# Patient Record
Sex: Female | Born: 1995 | Race: White | Hispanic: No | Marital: Single | Smoking: Never smoker
Health system: Southern US, Community
[De-identification: ages and names within clinical notes are randomized; demographics above are authoritative.]

## PROBLEM LIST (undated history)

## (undated) ENCOUNTER — Inpatient Hospital Stay (HOSPITAL_COMMUNITY): Payer: Self-pay

## (undated) DIAGNOSIS — R51 Headache: Secondary | ICD-10-CM

## (undated) HISTORY — PX: OTHER SURGICAL HISTORY: SHX169

---
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
## 2018-06-06 ENCOUNTER — Other Ambulatory Visit (HOSPITAL_COMMUNITY)
Admission: RE | Admit: 2018-06-06 | Discharge: 2018-06-06 | Disposition: A | Discharge status: Home / Self Care | Payer: Medicaid Other | Source: Ambulatory Visit | Attending: Obstetrics & Gynecology | Admitting: Obstetrics & Gynecology

## 2018-06-06 ENCOUNTER — Other Ambulatory Visit: Payer: Self-pay

## 2018-06-06 ENCOUNTER — Encounter: Payer: Self-pay | Admitting: Women's Health

## 2018-06-06 ENCOUNTER — Ambulatory Visit (INDEPENDENT_AMBULATORY_CARE_PROVIDER_SITE_OTHER): Payer: Medicaid Other | Admitting: Women's Health

## 2018-06-06 VITALS — BP 124/77 | HR 98 | Ht 62.0 in | Wt 120.0 lb

## 2018-06-06 DIAGNOSIS — Z309 Encounter for contraceptive management, unspecified: Secondary | ICD-10-CM | POA: Diagnosis not present

## 2018-06-06 DIAGNOSIS — Z3009 Encounter for other general counseling and advice on contraception: Secondary | ICD-10-CM | POA: Diagnosis not present

## 2018-06-06 DIAGNOSIS — Z01419 Encounter for gynecological examination (general) (routine) without abnormal findings: Secondary | ICD-10-CM

## 2018-06-06 DIAGNOSIS — Z113 Encounter for screening for infections with a predominantly sexual mode of transmission: Secondary | ICD-10-CM | POA: Diagnosis not present

## 2018-06-06 LAB — POCT WET PREP (WET MOUNT)
Clue Cells Wet Prep Whiff POC: POSITIVE
Trichomonas Wet Prep HPF POC: ABSENT

## 2018-06-06 MED ORDER — METRONIDAZOLE 500 MG PO TABS: 500.0000 mg | ORAL_TABLET | Freq: Two times a day (BID) | ORAL | 0 refills | Status: DC

## 2018-06-06 NOTE — Progress Notes (Signed)
WELL-WOMAN EXAMINATION Patient name: Claudia Marks MRN [id number]  Date of birth: Jan 21, 1996 Chief Complaint:   Gynecologic Exam  History of Present Illness:   Claudia Marks is a 22 y.o. G22P1001 Caucasian female being seen today for a routine FP [facility] well-woman exam.  Current complaints: vaginal d/c w/ odor, no itching/irritation  PCP: none      No LMP recorded. Patient has had an implant. The current method of family planning is Nexplanon placed 2016, due to come out Last pap never. Results were: n/a Last mammogram: never. Results were: n/a Last colonoscopy: never. Results were: n/a  Review of Systems:   Pertinent items are noted in HPI Denies any headaches, blurred vision, fatigue, shortness of breath, chest pain, abdominal pain, abnormal vaginal discharge/itching/odor/irritation, problems with periods, bowel movements, urination, or intercourse unless otherwise stated above. Pertinent History Reviewed:  Reviewed past medical,surgical, social and family history.  Reviewed problem list, medications and allergies. Physical Assessment:   Vitals:   06/06/18 1022  BP: 124/77  Pulse: 98  Weight: 120 lb (54.4 kg)  Height: 5\' 2"  (1.575 m)  Body mass index is 21.95 kg/m.        Physical Examination:   General appearance - well appearing, and in no distress  Mental status - alert, oriented to person, place, and time  Psych:  She has a normal mood and affect  Skin - warm and dry, normal color, no suspicious lesions noted  Chest - effort normal, all lung fields clear to auscultation bilaterally  Heart - normal rate and regular rhythm  Neck:  midline trachea, no thyromegaly or nodules  Breasts - breasts appear normal, no suspicious masses, no skin or nipple changes or  axillary nodes  Abdomen - soft, nontender, nondistended, no masses or organomegaly  Pelvic - VULVA: normal appearing vulva with no masses, tenderness or lesions  VAGINA: normal appearing vagina with normal color and  malodorous discharge, no lesions  CERVIX: normal appearing cervix without discharge or lesions, no CMT  Thin prep pap is done w/ reflex HR HPV cotesting  UTERUS: uterus is felt to be normal size, shape, consistency and nontender   ADNEXA: No adnexal masses or tenderness noted.  Extremities:  No swelling or varicosities noted  Results for orders placed or performed in visit on 06/06/18 (from the past 24 hour(s))  POCT Wet Prep [facility])   Collection Time: 06/06/18 11:21 AM  Result Value Ref Range   Source Wet Prep POC vaginal    WBC, Wet Prep HPF POC few    Bacteria Wet Prep HPF POC Few Few   BACTERIA WET PREP MORPHOLOGY POC     Clue Cells Wet Prep HPF POC Many (A) None   Clue Cells Wet Prep Whiff POC Positive Whiff    Yeast Wet Prep HPF POC None None   KOH Wet Prep POC     Trichomonas Wet Prep HPF POC Absent Absent    Assessment & Plan:  1) FP [facility] Well-Woman Exam  2) BV> Rx metronidazole 500mg  BID x 7d for BV, no sex or etoh while taking   Labs/procedures today: pap w/ gc/ct, wet prep, hiv, rpr  Mammogram @21yo  or sooner if problems Colonoscopy @21yo  or sooner if problems  Orders Placed This Encounter  Procedures  . HIV Antibody (routine testing w rflx)  . RPR  . POCT Wet Prep [facility])    Follow-up: Return in about 3 weeks (around 06/27/2018) for Nexplanon removal/re insertion, order today please.  [place], [facility] 06/06/2018 11:21 AM

## 2018-06-07 LAB — RPR: RPR Ser Ql: NONREACTIVE

## 2018-06-07 LAB — CYTOLOGY - PAP
CHLAMYDIA, DNA PROBE: NEGATIVE
Diagnosis: NEGATIVE
NEISSERIA GONORRHEA: NEGATIVE

## 2018-06-07 LAB — HIV ANTIBODY (ROUTINE TESTING W REFLEX): HIV Screen 4th Generation wRfx: NONREACTIVE

## 2018-06-27 ENCOUNTER — Other Ambulatory Visit: Payer: Self-pay

## 2018-06-27 ENCOUNTER — Ambulatory Visit (INDEPENDENT_AMBULATORY_CARE_PROVIDER_SITE_OTHER): Payer: Medicaid Other | Admitting: Women's Health

## 2018-06-27 ENCOUNTER — Encounter: Payer: Self-pay | Admitting: Women's Health

## 2018-06-27 VITALS — BP 116/75 | HR 72 | Ht 62.0 in | Wt 121.0 lb

## 2018-06-27 DIAGNOSIS — Z3202 Encounter for pregnancy test, result negative: Secondary | ICD-10-CM | POA: Diagnosis not present

## 2018-06-27 DIAGNOSIS — Z3046 Encounter for surveillance of implantable subdermal contraceptive: Secondary | ICD-10-CM

## 2018-06-27 DIAGNOSIS — Z3049 Encounter for surveillance of other contraceptives: Secondary | ICD-10-CM | POA: Diagnosis not present

## 2018-06-27 DIAGNOSIS — Z30017 Encounter for initial prescription of implantable subdermal contraceptive: Secondary | ICD-10-CM | POA: Insufficient documentation

## 2018-06-27 LAB — POCT URINE PREGNANCY: Preg Test, Ur: NEGATIVE

## 2018-06-27 MED ORDER — ETONOGESTREL 68 MG ~~LOC~~ IMPL
68.0000 mg | DRUG_IMPLANT | Freq: Once | SUBCUTANEOUS | Status: AC
  Administered 2018-06-27: 68 mg via SUBCUTANEOUS

## 2018-06-27 NOTE — Patient Instructions (Signed)

## 2018-06-27 NOTE — Progress Notes (Signed)
   NEXPLANON REMOVAL AND RE-INSERTION Patient name: Claudia Marks MRN [id number]  Date of birth: Feb 13, 1996 Subjective Findings:   Claudia Marks is a 22 y.o. G42P1001 Caucasian female being seen today for Nexplanon removal and re-insertion. Her Nexplanon was placed 2016.   Patient's last menstrual period was 06/08/2018. Last pap 06/06/18. Results were:  normal  Risks/benefits/side effects of Nexplanon have been discussed and her questions have been answered.  Specifically, a failure rate of 09/998 has been reported, with an increased failure rate if pt takes [place] and/or antiseizure medicaitons.  She is aware of the common side effect of irregular bleeding, which the incidence of decreases over time. Signed copy of informed consent in chart.  Pertinent History Reviewed:   Reviewed past medical,surgical, social, obstetrical and family history.  Reviewed problem list, medications and allergies. Objective Findings & Procedure:    Vitals:   06/27/18 1532  BP: 116/75  Pulse: 72  Weight: 121 lb (54.9 kg)  Height: 5\' 2"  (1.575 m)  Body mass index is 22.13 kg/m.  Results for orders placed or performed in visit on 06/27/18 (from the past 24 hour(s))  POCT urine pregnancy   Collection Time: 06/27/18  3:34 PM  Result Value Ref Range   Preg Test, Ur Negative Negative     Time out was performed.  Nexplanon site identified.  Area prepped in usual sterile fashon. Two cc's of 2% lidocaine was used to anesthetize the area. A small stab incision was made right beside the implant on the distal portion.  The Nexplanon rod was grasped using hemostats and removed intact without difficulty.  The area was cleansed again with betadine and the Nexplanon was inserted approximately 10cm from the medial epicondyle and 3-5cm posterior to the sulcus per manufacturer's recommendations without difficulty.  Steri-strips and a pressure bandage was applied.  There was less than 3 cc blood loss. There were no  complications.  The patient tolerated the procedure well. Assessment & Plan:   1) Nexplanon removal & re-insertion She was instructed to keep the area clean and dry, remove pressure bandage in 24 hours, and keep insertion site covered with the steri-strips for 3-5 days.  She was given a card indicating date Nexplanon was inserted and date it needs to be removed.  Follow-up PRN problems.  Orders Placed This Encounter  Procedures  . POCT urine pregnancy    Follow-up: Return for after 9/16 for physical.  Roma Schanz CNM, [facility] 06/27/2018 4:06 PM

## 2019-09-28 ENCOUNTER — Ambulatory Visit: Payer: Medicaid Other | Attending: Internal Medicine

## 2019-09-28 ENCOUNTER — Other Ambulatory Visit: Payer: Self-pay

## 2019-09-28 DIAGNOSIS — Z20822 Contact with and (suspected) exposure to covid-19: Secondary | ICD-10-CM

## 2019-09-30 LAB — NOVEL CORONAVIRUS, NAA: SARS-CoV-2, NAA: NOT DETECTED

## 2020-05-23 ENCOUNTER — Encounter: Payer: Self-pay | Admitting: Women's Health

## 2020-05-23 ENCOUNTER — Ambulatory Visit (INDEPENDENT_AMBULATORY_CARE_PROVIDER_SITE_OTHER): Payer: BC Managed Care – PPO | Admitting: Women's Health

## 2020-05-23 VITALS — BP 108/75 | HR 75 | Ht 62.0 in | Wt 132.0 lb

## 2020-05-23 DIAGNOSIS — Z3046 Encounter for surveillance of implantable subdermal contraceptive: Secondary | ICD-10-CM

## 2020-05-23 NOTE — Patient Instructions (Signed)
Keep the area clean and dry.  You can remove the big bandage in 24 hours, and the small steri-strip bandage in 3-5 days.  

## 2020-05-23 NOTE — Progress Notes (Signed)
   [facility] REMOVAL Patient name: Claudia Marks MRN [id number]  Date of birth: 1996-05-05 Subjective Findings:   Claudia Marks is a 24 y.o. G52P1001 Caucasian female being seen today for removal of a Nexplanon. Her Nexplanon was placed 06/27/18.  She desires removal because she wants to get pregnant. Signed copy of informed consent in chart.   No LMP recorded. Patient has had an implant. Last pap9/16/19. Results were:  normal The planned method of family planning is none Depression screen PHQ 2/9 06/06/2018  Decreased Interest 0  Down, Depressed, Hopeless 0  PHQ - 2 Score 0    Pertinent History Reviewed:   Reviewed past medical,surgical, social, obstetrical and family history.  Reviewed problem list, medications and allergies. Objective Findings & Procedure:    Vitals:   05/23/20 1343  BP: 108/75  Pulse: 75  Weight: 132 lb (59.9 kg)  Height: 5\' 2"  (1.575 m)  Body mass index is 24.14 kg/m.  No results found for this or any previous visit (from the past 24 hour(s)).   Time out was performed.  Nexplanon site identified.  Area prepped in usual sterile fashon. One cc of 2% lidocaine was used to anesthetize the area at the distal end of the implant. A small stab incision was made right beside the implant on the distal portion.  The Nexplanon rod was grasped using hemostats and removed without difficulty.  There was less than 3 cc blood loss. There were no complications.  Steri-strips were applied over the small incision and a pressure bandage was applied.  The patient tolerated the procedure well. Assessment & Plan:   1) Nexplanon removal She was instructed to keep the area clean and dry, remove pressure bandage in 24 hours, and keep insertion site covered with the steri-strip for 3-5 days.   Follow-up PRN problems. Start pnv now, let us know when gets +HPT.   No orders of the defined types were placed in this encounter.   Follow-up: Return in about 1 year (around 05/23/2021) for Pap  & physical.  Roma Schanz CNM, [facility] 05/23/2020 2:05 PM

## 2020-07-01 ENCOUNTER — Other Ambulatory Visit: Payer: Medicaid Other

## 2020-07-01 ENCOUNTER — Other Ambulatory Visit: Payer: Self-pay | Admitting: *Deleted

## 2020-07-01 DIAGNOSIS — Z20822 Contact with and (suspected) exposure to covid-19: Secondary | ICD-10-CM

## 2020-07-02 LAB — SARS-COV-2, NAA 2 DAY TAT

## 2020-07-02 LAB — SPECIMEN STATUS REPORT

## 2020-07-02 LAB — NOVEL CORONAVIRUS, NAA: SARS-CoV-2, NAA: NOT DETECTED

## 2020-09-19 ENCOUNTER — Other Ambulatory Visit: Payer: Self-pay

## 2020-09-19 ENCOUNTER — Ambulatory Visit (INDEPENDENT_AMBULATORY_CARE_PROVIDER_SITE_OTHER): Payer: BC Managed Care – PPO | Admitting: *Deleted

## 2020-09-19 VITALS — BP 115/73 | HR 81

## 2020-09-19 DIAGNOSIS — Z3201 Encounter for pregnancy test, result positive: Secondary | ICD-10-CM | POA: Diagnosis not present

## 2020-09-19 DIAGNOSIS — N926 Irregular menstruation, unspecified: Secondary | ICD-10-CM | POA: Diagnosis not present

## 2020-09-19 LAB — POCT URINE PREGNANCY: Preg Test, Ur: POSITIVE — AB

## 2020-09-19 NOTE — Progress Notes (Signed)
   NURSE VISIT- PREGNANCY CONFIRMATION   SUBJECTIVE:  Claudia Marks is a 24 y.o. G40P1001 female at [redacted]w[redacted]d by certain LMP of Patient's last menstrual period was 08/03/2020. Here for pregnancy confirmation.  Home pregnancy test: positive x 7  She reports nausea.  She is taking prenatal vitamins.    OBJECTIVE:  BP 115/73 (BP Location: Left Arm, Patient Position: Sitting, Cuff Size: Normal)   Pulse 81   LMP 08/03/2020   Appears well, in no apparent distress OB History  Gravida Para Term Preterm AB Living  2 1 1     1   SAB IAB Ectopic Multiple Live Births          1    # Outcome Date GA Lbr Len/2nd Weight Sex Delivery Anes PTL Lv  2 Current           1 Term 01/27/15 [redacted]w[redacted]d 07:03 / 01:00 8 lb 3.8 oz (3.737 kg) F Vag-Spont EPI  LIV    Results for orders placed or performed in visit on 09/19/20 (from the past 24 hour(s))  POCT urine pregnancy   Collection Time: 09/19/20  3:05 PM  Result Value Ref Range   Preg Test, Ur Positive (A) Negative    ASSESSMENT: Positive pregnancy test, [redacted]w[redacted]d by LMP    PLAN: Schedule for dating ultrasound in 1-2 weeks Prenatal vitamins: continue   Nausea medicines: not currently needed   OB packet given: Yes  [redacted]w[redacted]d  09/19/2020 3:11 PM

## 2020-09-19 NOTE — Progress Notes (Signed)
Chart reviewed for nurse visit. Agree with plan of care.  Kenyata Guess A, NP 09/19/2020 5:03 PM  

## 2020-09-19 NOTE — Progress Notes (Signed)
Chart reviewed for nurse visit. Agree with plan of care.  Adline Potter, NP 09/19/2020 5:03 PM

## 2020-09-21 NOTE — L&D Delivery Note (Signed)
[facility] Delivery Note  Claudia Marks is a 25 y.o. G2P1001 s/p NSVD at [redacted]w[redacted]d She was admitted for spontaneous labor at term. With known gestational thrombocytopenia    ROM: 0h 261mith Clear fluid GBS Status: Negative Maximum Maternal Temperature: 97.9  Labor Progress: Patient admitted for spontaneous labor with normal progress. After AROM patient was quickly fully dilated.   Delivery Date/Time: 81/19/2022 at 10:03am Delivery: Called to room and patient was complete and pushing. Fetus was in a prolonged deceleration likely related to rapid progress after AROM. Head delivered LOA with Ritgen maneuver. No nuchal cord present. Shoulder and body delivered in usual fashion. Infant with spontaneous cry, placed on mother's abdomen, dried and stimulated. Cord clamped x 2 immediately, and cut by CNM. Baby passed to waiting NICU team. Cord blood drawn. Placenta delivered spontaneously with gentle cord traction. Fundus firm with massage and Pitocin. Labia, perineum, vagina, and cervix inspected inspected with no lacerations.   Placenta: spontaneous/complete/intact Complications: None Lacerations: None EBL: 200 cc  Analgesia: Epidural   Postpartum Planning '[X]'$  message to sent to schedule follow-up  '[X]'$  vaccines UTD  Infant: Female  APGARs 8/9  weight pending  HeMarcille BuffyNP, CNM  05/09/21  10:17 AM

## 2020-10-02 ENCOUNTER — Encounter: Payer: Self-pay | Admitting: *Deleted

## 2020-10-02 ENCOUNTER — Other Ambulatory Visit: Payer: Self-pay | Admitting: Obstetrics & Gynecology

## 2020-10-02 DIAGNOSIS — O3680X0 Pregnancy with inconclusive fetal viability, not applicable or unspecified: Secondary | ICD-10-CM

## 2020-10-03 ENCOUNTER — Other Ambulatory Visit: Payer: Self-pay

## 2020-10-03 ENCOUNTER — Ambulatory Visit (INDEPENDENT_AMBULATORY_CARE_PROVIDER_SITE_OTHER): Payer: BC Managed Care – PPO

## 2020-10-03 DIAGNOSIS — Z3A08 8 weeks gestation of pregnancy: Secondary | ICD-10-CM | POA: Diagnosis not present

## 2020-10-03 DIAGNOSIS — O3680X0 Pregnancy with inconclusive fetal viability, not applicable or unspecified: Secondary | ICD-10-CM | POA: Diagnosis not present

## 2020-10-03 NOTE — Progress Notes (Signed)
[place] 8+5 wks,single IUP with YS,positive fht 171 bpm,normal ovaries,crl 18.50 mm

## 2020-10-24 ENCOUNTER — Other Ambulatory Visit: Payer: BC Managed Care – PPO

## 2020-10-24 DIAGNOSIS — Z20822 Contact with and (suspected) exposure to covid-19: Secondary | ICD-10-CM

## 2020-10-25 LAB — SPECIMEN STATUS REPORT

## 2020-10-25 LAB — NOVEL CORONAVIRUS, NAA: SARS-CoV-2, NAA: NOT DETECTED

## 2020-10-25 LAB — SARS-COV-2, NAA 2 DAY TAT

## 2020-10-30 ENCOUNTER — Other Ambulatory Visit: Payer: Self-pay | Admitting: Obstetrics & Gynecology

## 2020-10-30 DIAGNOSIS — Z3682 Encounter for antenatal screening for nuchal translucency: Secondary | ICD-10-CM

## 2020-10-31 ENCOUNTER — Ambulatory Visit: Payer: BC Managed Care – PPO | Admitting: *Deleted

## 2020-10-31 ENCOUNTER — Ambulatory Visit (INDEPENDENT_AMBULATORY_CARE_PROVIDER_SITE_OTHER): Payer: BC Managed Care – PPO | Admitting: Advanced Practice Midwife

## 2020-10-31 ENCOUNTER — Ambulatory Visit (INDEPENDENT_AMBULATORY_CARE_PROVIDER_SITE_OTHER): Payer: BC Managed Care – PPO

## 2020-10-31 ENCOUNTER — Encounter: Payer: Self-pay | Admitting: Advanced Practice Midwife

## 2020-10-31 ENCOUNTER — Other Ambulatory Visit: Payer: Self-pay

## 2020-10-31 VITALS — BP 104/72 | HR 71 | Wt 128.0 lb

## 2020-10-31 DIAGNOSIS — Z3490 Encounter for supervision of normal pregnancy, unspecified, unspecified trimester: Secondary | ICD-10-CM | POA: Insufficient documentation

## 2020-10-31 DIAGNOSIS — Z3480 Encounter for supervision of other normal pregnancy, unspecified trimester: Secondary | ICD-10-CM

## 2020-10-31 DIAGNOSIS — Z3682 Encounter for antenatal screening for nuchal translucency: Secondary | ICD-10-CM | POA: Diagnosis not present

## 2020-10-31 DIAGNOSIS — Z3A12 12 weeks gestation of pregnancy: Secondary | ICD-10-CM | POA: Diagnosis not present

## 2020-10-31 DIAGNOSIS — Z31430 Encounter of female for testing for genetic disease carrier status for procreative management: Secondary | ICD-10-CM | POA: Diagnosis not present

## 2020-10-31 DIAGNOSIS — Z3481 Encounter for supervision of other normal pregnancy, first trimester: Secondary | ICD-10-CM | POA: Diagnosis not present

## 2020-10-31 LAB — POCT URINALYSIS DIPSTICK OB
Blood, UA: NEGATIVE
Glucose, UA: NEGATIVE
Ketones, UA: NEGATIVE
Leukocytes, UA: NEGATIVE
Nitrite, UA: NEGATIVE
POC,PROTEIN,UA: NEGATIVE

## 2020-10-31 MED ORDER — ONDANSETRON 4 MG PO TBDP
4.0000 mg | ORAL_TABLET | Freq: Four times a day (QID) | ORAL | 2 refills | Status: DC | PRN
Start: 2020-10-31 — End: 2021-03-14

## 2020-10-31 MED ORDER — DOXYLAMINE-PYRIDOXINE 10-10 MG PO TBEC: DELAYED_RELEASE_TABLET | ORAL | 6 refills | Status: DC

## 2020-10-31 NOTE — Progress Notes (Addendum)
INITIAL OBSTETRICAL VISIT Patient name: Claudia Marks MRN [id number]  Date of birth: 1995-11-26 Chief Complaint:   Initial Prenatal Visit (Nt/it, nausea)  History of Present Illness:   Claudia Marks is a 25 y.o. G16P1001 Caucasian female at [redacted]w[redacted]d by LMP c/w u/s at 8 weeks with an Estimated Date of Delivery: 05/10/21 being seen today for her initial obstetrical visit.   Her obstetrical history is significant for term SVD w/o problems.   Today she reports still lots of nausea and vomiting.  Nearly fainted when had her blood drawn earlier.  Depression screen [facility] 2/9 10/31/2020 06/06/2018  Decreased Interest 2 0  Down, Depressed, Hopeless 1 0  PHQ - 2 Score 3 0  Altered sleeping 1 -  Tired, decreased energy 3 -  Change in appetite 3 -  Feeling bad or failure about yourself  0 -  Trouble concentrating 2 -  Moving slowly or fidgety/restless 0 -  Suicidal thoughts 0 -  PHQ-9 Score 12 -    Patient's last menstrual period was 08/03/2020. Last pap 2019. Results were: normal Review of Systems:   Pertinent items are noted in HPI Denies cramping/contractions, leakage of fluid, vaginal bleeding, abnormal vaginal discharge w/ itching/odor/irritation, headaches, visual changes, shortness of breath, chest pain, abdominal pain, severe nausea/vomiting, or problems with urination or bowel movements unless otherwise stated above.  Pertinent History Reviewed:  Reviewed past medical,surgical, social, obstetrical and family history.  Reviewed problem list, medications and allergies. OB History  Gravida Para Term Preterm AB Living  2 1 1     1   SAB IAB Ectopic Multiple Live Births          1    # Outcome Date GA Lbr Len/2nd Weight Sex Delivery Anes PTL Lv  2 Current           1 Term 01/27/15 [redacted]w[redacted]d 07:03 / 01:00 8 lb 3.8 oz (3.737 kg) F Vag-Spont EPI N LIV   Physical Assessment:   Vitals:   10/31/20 0955  BP: 104/72  Pulse: 71  Weight: 128 lb (58.1 kg)  Body mass index is 23.41 kg/m.        Physical Examination:  General appearance - well appearing, and in no distress  Mental status - alert, oriented to person, place, and time  Psych:  She has a normal mood and affect  Skin - warm and dry, normal color, no suspicious lesions noted  Chest - effort normal  Heart - normal rate and regular rhythm  Abdomen - soft, nontender  Extremities:  No swelling or varicosities noted  Pelvic - VULVA: normal appearing vulva with no masses, tenderness or lesions  VAGINA: normal appearing vagina with normal color and discharge, no lesions  CERVIX: normal appearing cervix without discharge or lesions, no CMT  Thin prep pap is not done   TODAY'S NT [place] 12+5 wks,measurements c/w dates,CRL 65.52 mm,normal ovaries,NB present,NT 1.5 mm,fhr 170 BPM,posterior placenta  Results for orders placed or performed in visit on 10/31/20 (from the past 24 hour(s))  POC Urinalysis Dipstick OB   Collection Time: 10/31/20 10:23 AM  Result Value Ref Range   Color, UA     Clarity, UA     Glucose, UA Negative Negative   Bilirubin, UA     Ketones, UA neg    Spec Grav, UA     Blood, UA neg    pH, UA     POC,PROTEIN,UA Negative Negative, Trace, Small (1+), Moderate (2+), Large (3+), 4+  Urobilinogen, UA     Nitrite, UA neg    Leukocytes, UA Negative Negative   Appearance     Odor      Assessment & Plan:  1) Low-Risk Pregnancy G2P1001 at [redacted]w[redacted]d with an Estimated Date of Delivery: 05/10/21   2) Initial OB visit  3) N/V:  rx diclegis and zofran  Meds: No orders of the defined types were placed in this encounter.   Initial labs obtained Continue prenatal vitamins Reviewed n/v relief measures and warning s/s to report Reviewed recommended weight gain based on pre-gravid BMI Encouraged well-balanced diet Genetic & carrier screening discussed: requests Panorama, NT/IT and Horizon 14 , declines AFP Ultrasound discussed; fetal survey: requested CCNC completed> form faxed if has or is planning to apply for  medicaid The nature of [facility] [employer] with multiple MDs and other Advanced Practice Providers was explained to patient; also emphasized that fellows, residents, and students are part of our team. Given a home bp cuff. Check bp weekly, let us know if >140/90.        Joaquim Lai Cresenzo-Dishmon 10:33 AM

## 2020-10-31 NOTE — Patient Instructions (Signed)
Claudia Marks, I greatly value your feedback.  If you receive a survey following your visit with [place] today, we appreciate you taking the time to fill it out.  Thanks, Nigel Berthold, DNP, [place]!!! It is now [place] [facility] ([place],  [id number]) Entrance located off of [address] parking   Nausea & Vomiting  Have saltine crackers or pretzels by your bed and eat a few bites before you raise your head out of bed in the morning  Eat small frequent meals throughout the day instead of large meals  Drink plenty of fluids throughout the day to stay hydrated, just don't drink a lot of fluids with your meals.  This can make your stomach fill up faster making you feel sick  Do not brush your teeth right after you eat  Products with real ginger are good for nausea, like ginger ale and ginger hard candy Make sure it says made with real ginger!  Sucking on sour candy like lemon heads is also good for nausea  If your prenatal vitamins make you nauseated, take them at night so you will sleep through the nausea  Sea Bands  If you feel like you need medicine for the nausea & vomiting please let us know  If you are unable to keep any fluids or food down please let us know   Constipation  Drink plenty of fluid, preferably water, throughout the day  Eat foods high in fiber such as fruits, vegetables, and grains  Exercise, such as walking, is a good way to keep your bowels regular  Drink warm fluids, especially warm prune juice, or decaf coffee  Eat a 1/2 cup of real oatmeal (not instant), 1/2 cup applesauce, and 1/2-1 cup warm prune juice every day  If needed, you may take Colace (docusate sodium) stool softener once or twice a day to help keep the stool soft.   If you still are having problems with constipation, you may take Miralax once daily as needed to help keep your bowels regular.   Home  Blood Pressure Monitoring for Patients   Your provider has recommended that you check your blood pressure (BP) at least once a week at home. If you do not have a blood pressure cuff at home, one will be provided for you. Contact your provider if you have not received your monitor within 1 week.   Helpful Tips for Accurate Home Blood Pressure Checks  . Don't smoke, exercise, or drink caffeine 30 minutes before checking your BP . Use the restroom before checking your BP (a full bladder can raise your pressure) . Relax in a comfortable upright chair . Feet on the ground . Left arm resting comfortably on a flat surface at the level of your heart . Legs uncrossed . Back supported . Sit quietly and don't talk . Place the cuff on your bare arm . Adjust snuggly, so that only two fingertips can fit between your skin and the top of the cuff . Check 2 readings separated by at least one minute . Keep a log of your BP readings . For a visual, please reference this diagram: http://ccnc.care/bpdiagram  Provider Name: [facility]     Phone: [phone]  Zone 1: ALL CLEAR  Continue to monitor your symptoms:  . BP reading is less than 140 (top number) or less than 90 (bottom number)  . No right upper stomach  pain . No headaches or seeing spots . No feeling nauseated or throwing up . No swelling in face and hands  Zone 2: CAUTION Call your doctor's office for any of the following:  . BP reading is greater than 140 (top number) or greater than 90 (bottom number)  . Stomach pain under your ribs in the middle or right side . Headaches or seeing spots . Feeling nauseated or throwing up . Swelling in face and hands  Zone 3: EMERGENCY  Seek immediate medical care if you have any of the following:  . BP reading is greater than160 (top number) or greater than 110 (bottom number) . Severe headaches not improving with Tylenol . Serious difficulty catching your breath . Any worsening symptoms  from Zone 2    First Trimester of Pregnancy The first trimester of pregnancy is from week 1 until the end of week 12 (months 1 through 3). A week after a sperm fertilizes an egg, the egg will implant on the wall of the uterus. This embryo will begin to develop into a baby. Genes from you and your partner are forming the baby. The female genes determine whether the baby is a boy or a girl. At 6-8 weeks, the eyes and face are formed, and the heartbeat can be seen on ultrasound. At the end of 12 weeks, all the baby's organs are formed.  Now that you are pregnant, you will want to do everything you can to have a healthy baby. Two of the most important things are to get good prenatal care and to follow your health care provider's instructions. Prenatal care is all the medical care you receive before the baby's birth. This care will help prevent, find, and treat any problems during the pregnancy and childbirth. BODY CHANGES Your body goes through many changes during pregnancy. The changes vary from woman to woman.   You may gain or lose a couple of pounds at first.  You may feel sick to your stomach (nauseous) and throw up (vomit). If the vomiting is uncontrollable, call your health care provider.  You may tire easily.  You may develop headaches that can be relieved by medicines approved by your health care provider.  You may urinate more often. Painful urination may mean you have a bladder infection.  You may develop heartburn as a result of your pregnancy.  You may develop constipation because certain hormones are causing the muscles that push waste through your intestines to slow down.  You may develop hemorrhoids or swollen, bulging veins (varicose veins).  Your breasts may begin to grow larger and become tender. Your nipples may stick out more, and the tissue that surrounds them (areola) may become darker.  Your gums may bleed and may be sensitive to brushing and flossing.  Dark spots or  blotches (chloasma, mask of pregnancy) may develop on your face. This will likely fade after the baby is born.  Your menstrual periods will stop.  You may have a loss of appetite.  You may develop cravings for certain kinds of food.  You may have changes in your emotions from day to day, such as being excited to be pregnant or being concerned that something may go wrong with the pregnancy and baby.  You may have more vivid and strange dreams.  You may have changes in your hair. These can include thickening of your hair, rapid growth, and changes in texture. Some women also have hair loss during or after pregnancy, or hair that  feels dry or thin. Your hair will most likely return to normal after your baby is born. WHAT TO EXPECT AT YOUR PRENATAL VISITS During a routine prenatal visit:  You will be weighed to make sure you and the baby are growing normally.  Your blood pressure will be taken.  Your abdomen will be measured to track your baby's growth.  The fetal heartbeat will be listened to starting around week 10 or 12 of your pregnancy.  Test results from any previous visits will be discussed. Your health care provider may ask you:  How you are feeling.  If you are feeling the baby move.  If you have had any abnormal symptoms, such as leaking fluid, bleeding, severe headaches, or abdominal cramping.  If you have any questions. Other tests that may be performed during your first trimester include:  Blood tests to find your blood type and to check for the presence of any previous infections. They will also be used to check for low iron levels (anemia) and Rh antibodies. Later in the pregnancy, blood tests for diabetes will be done along with other tests if problems develop.  Urine tests to check for infections, diabetes, or protein in the urine.  An ultrasound to confirm the proper growth and development of the baby.  An amniocentesis to check for possible genetic  problems.  Fetal screens for spina bifida and Down syndrome.  You may need other tests to make sure you and the baby are doing well. HOME CARE INSTRUCTIONS  Medicines  Follow your health care provider's instructions regarding medicine use. Specific medicines may be either safe or unsafe to take during pregnancy.  Take your prenatal vitamins as directed.  If you develop constipation, try taking a stool softener if your health care provider approves. Diet  Eat regular, well-balanced meals. Choose a variety of foods, such as meat or vegetable-based protein, fish, milk and low-fat dairy products, vegetables, fruits, and whole grain breads and cereals. Your health care provider will help you determine the amount of weight gain that is right for you.  Avoid raw meat and uncooked cheese. These carry germs that can cause birth defects in the baby.  Eating four or five small meals rather than three large meals a day may help relieve nausea and vomiting. If you start to feel nauseous, eating a few soda crackers can be helpful. Drinking liquids between meals instead of during meals also seems to help nausea and vomiting.  If you develop constipation, eat more high-fiber foods, such as fresh vegetables or fruit and whole grains. Drink enough fluids to keep your urine clear or pale yellow. Activity and Exercise  Exercise only as directed by your health care provider. Exercising will help you:  Control your weight.  Stay in shape.  Be prepared for labor and delivery.  Experiencing pain or cramping in the lower abdomen or low back is a good sign that you should stop exercising. Check with your health care provider before continuing normal exercises.  Try to avoid standing for long periods of time. Move your legs often if you must stand in one place for a long time.  Avoid heavy lifting.  Wear low-heeled shoes, and practice good posture.  You may continue to have sex unless your health care  provider directs you otherwise. Relief of Pain or Discomfort  Wear a good support bra for breast tenderness.    Take warm sitz baths to soothe any pain or discomfort caused by hemorrhoids. Use hemorrhoid cream  if your health care provider approves.    Rest with your legs elevated if you have leg cramps or low back pain.  If you develop varicose veins in your legs, wear support hose. Elevate your feet for 15 minutes, 3-4 times a day. Limit salt in your diet. Prenatal Care  Schedule your prenatal visits by the twelfth week of pregnancy. They are usually scheduled monthly at first, then more often in the last 2 months before delivery.  Write down your questions. Take them to your prenatal visits.  Keep all your prenatal visits as directed by your health care provider. Safety  Wear your seat belt at all times when driving.  Make a list of emergency phone numbers, including numbers for family, friends, the hospital, and police and fire departments. General Tips  Ask your health care provider for a referral to a local prenatal education class. Begin classes no later than at the beginning of month 6 of your pregnancy.  Ask for help if you have counseling or nutritional needs during pregnancy. Your health care provider can offer advice or refer you to specialists for help with various needs.  Do not use hot tubs, steam rooms, or saunas.  Do not douche or use tampons or scented sanitary pads.  Do not cross your legs for long periods of time.  Avoid cat litter boxes and soil used by cats. These carry germs that can cause birth defects in the baby and possibly loss of the fetus by miscarriage or stillbirth.  Avoid all smoking, herbs, alcohol, and medicines not prescribed by your health care provider. Chemicals in these affect the formation and growth of the baby.  Schedule a dentist appointment. At home, brush your teeth with a soft toothbrush and be gentle when you floss. SEEK MEDICAL  CARE IF:   You have dizziness.  You have mild pelvic cramps, pelvic pressure, or nagging pain in the abdominal area.  You have persistent nausea, vomiting, or diarrhea.  You have a bad smelling vaginal discharge.  You have pain with urination.  You notice increased swelling in your face, hands, legs, or ankles. SEEK IMMEDIATE MEDICAL CARE IF:   You have a fever.  You are leaking fluid from your vagina.  You have spotting or bleeding from your vagina.  You have severe abdominal cramping or pain.  You have rapid weight gain or loss.  You vomit blood or material that looks like coffee grounds.  You are exposed to [place] measles and have never had them.  You are exposed to fifth disease or chickenpox.  You develop a severe headache.  You have shortness of breath.  You have any kind of trauma, such as from a fall or a car accident. Document Released: 09/01/2001 Document Revised: 01/22/2014 Document Reviewed: 07/18/2013 [facility] Patient Information 2015 [place], [place]. This information is not intended to replace advice given to you by your health care provider. Make sure you discuss any questions you have with your health care provider.  Coronavirus (COVID-19) Are you at risk?  Are you at risk for the Coronavirus (COVID-19)?  To be considered HIGH RISK for Coronavirus (COVID-19), you have to meet the following criteria:  . Traveled to [place], [place], [place], [place] or [place];  and have fever, cough, and shortness of breath within the last 2 weeks of travel OR . Been in close contact with a person diagnosed with COVID-19 within the last 2 weeks and have fever, cough, and shortness of breath . IF YOU DO  NOT MEET THESE CRITERIA, YOU ARE CONSIDERED LOW RISK FOR COVID-19.  What to do if you are HIGH RISK for COVID-19?  Marland Kitchen If you are having a medical emergency, call 911. . Seek medical care right away. Before you go to a doctor's office, urgent care or emergency department,  call ahead and tell them about your recent travel, contact with someone diagnosed with COVID-19, and your symptoms. You should receive instructions from your physician's office regarding next steps of care.  . When you arrive at healthcare provider, tell the healthcare staff immediately you have returned from visiting [place], [place], [place], [place] or [place]; in the last two weeks or you have been in close contact with a person diagnosed with COVID-19 in the last 2 weeks.   . Tell the health care staff about your symptoms: fever, cough and shortness of breath. . After you have been seen by a medical provider, you will be either: o Tested for (COVID-19) and discharged home on quarantine except to seek medical care if symptoms worsen, and asked to  - Stay home and avoid contact with others until you get your results (4-5 days)  - Avoid travel on public transportation if possible (such as bus, train, or airplane) or o Sent to the Emergency Department by EMS for evaluation, COVID-19 testing, and possible admission depending on your condition and test results.  What to do if you are LOW RISK for COVID-19?  Reduce your risk of any infection by using the same precautions used for avoiding the common cold or flu:  Marland Kitchen Wash your hands often with soap and warm water for at least 20 seconds.  If soap and water are not readily available, use an alcohol-based hand sanitizer with at least 60% alcohol.  . If coughing or sneezing, cover your mouth and nose by coughing or sneezing into the elbow areas of your shirt or coat, into a tissue or into your sleeve (not your hands). . Avoid shaking hands with others and consider head nods or verbal greetings only. . Avoid touching your eyes, nose, or mouth with unwashed hands.  . Avoid close contact with people who are sick. . Avoid places or events with large numbers of people in one location, like concerts or sporting events. . Carefully consider travel plans you have or  are making. . If you are planning any travel outside or inside the [place], visit the CDC's Travelers' Health webpage for the latest health notices. . If you have some symptoms but not all symptoms, continue to monitor at home and seek medical attention if your symptoms worsen. . If you are having a medical emergency, call 911.   [place] / e-Visit: [email or web address]         [facility]: [facility]: [phone]                   [facility]: [phone]     Safe Medications in Pregnancy   Acne: Benzoyl Peroxide Salicylic Acid  Backache/Headache: Tylenol: 2 regular strength every 4 hours OR              2 Extra strength every 6 hours  Colds/Coughs/Allergies: Benadryl (alcohol free) 25 mg every 6 hours as needed Breath right strips Claritin Cepacol throat lozenges Chloraseptic throat spray Cold-Eeze- up to three times per day Cough drops, alcohol free Flonase (by prescription only) Guaifenesin Mucinex Robitussin DM (plain only, alcohol  free) Saline nasal spray/drops Sudafed (pseudoephedrine) & Actifed ** use only after [redacted] weeks gestation and if you do not have high blood pressure Tylenol Vicks Vaporub Zinc lozenges Zyrtec   Constipation: Colace Ducolax suppositories Fleet enema Glycerin suppositories Metamucil Milk of magnesia Miralax Senokot Smooth move tea  Diarrhea: Kaopectate Imodium A-D  *NO pepto Bismol  Hemorrhoids: Anusol Anusol HC Preparation H Tucks  Indigestion: Tums Maalox Mylanta Zantac  Pepcid  Insomnia: Benadryl (alcohol free) 25mg  every 6 hours as needed Tylenol PM Unisom, no Gelcaps  Leg Cramps: Tums MagGel  Nausea/Vomiting:  Bonine Dramamine Emetrol Ginger extract Sea bands Meclizine  Nausea medication to take during pregnancy:  Unisom (doxylamine succinate  25 mg tablets) Take one tablet daily at bedtime. If symptoms are not adequately controlled, the dose can be increased to a maximum recommended dose of two tablets daily (1/2 tablet in the morning, 1/2 tablet mid-afternoon and one at bedtime). Vitamin B6 100mg  tablets. Take one tablet twice a day (up to 200 mg per day).  Skin Rashes: Aveeno products Benadryl cream or 25mg  every 6 hours as needed Calamine Lotion 1% cortisone cream  Yeast infection: Gyne-lotrimin 7 Monistat 7   **If taking multiple medications, please check labels to avoid duplicating the same active ingredients **take medication as directed on the label ** Do not exceed 4000 mg of tylenol in 24 hours **Do not take medications that contain aspirin or ibuprofen   Meet the Provider [facility] is now offering FREE monthly 1-hour virtual Zoom sessions for new, current, and prospective patients.        During these sessions, you can:   Learn about our practice, model of care, services   Get answers to questions about pregnancy and birth during [place] your provider's brain about anything else!    Sessions will be hosted by [employer] for [employer], Engineer, materials, Physicians and Midwives          No registration required      2021 Dates:      All at 6pm     October 21st     November 18th   December 16th     January 20th  February 17th    To join one of these meetings, a few minutes before it is set to start:     Copy/paste the link into your web browser:  [email or web address]    OR  Scan the QR code below (open up your camera and point towards QR code; click on tab that pops up on your phone ("zoom")

## 2020-10-31 NOTE — Progress Notes (Signed)
[place] 12+5 wks,measurements c/w dates,CRL 65.52 mm,normal ovaries,NB present,NT 1.5 mm,fhr 170 BPM,posterior placenta

## 2020-11-01 LAB — GC/CHLAMYDIA PROBE AMP
Chlamydia trachomatis, NAA: NEGATIVE
Neisseria Gonorrhoeae by PCR: NEGATIVE

## 2020-11-01 LAB — CBC/D/PLT+RPR+RH+ABO+RUB AB...
EOS (ABSOLUTE): 0 10*3/uL (ref 0.0–0.4)
Eos: 0 %
HIV Screen 4th Generation wRfx: NONREACTIVE
Hemoglobin: 12.9 g/dL (ref 11.1–15.9)
Monocytes: 5 %

## 2020-11-01 LAB — INTEGRATED 1

## 2020-11-02 LAB — CBC/D/PLT+RPR+RH+ABO+RUB AB...
Antibody Screen: NEGATIVE
Basophils Absolute: 0 10*3/uL (ref 0.0–0.2)
Basos: 0 %
HCV Ab: <0.1 s/co ratio (ref 0.0–0.9)
Hematocrit: 38 % (ref 34.0–46.6)
Hepatitis B Surface Ag: NEGATIVE
Immature Grans (Abs): 0 10*3/uL (ref 0.0–0.1)
Immature Granulocytes: 0 %
Lymphocytes Absolute: 0.9 10*3/uL (ref 0.7–3.1)
Lymphs: 14 %
MCH: 31.5 pg (ref 26.6–33.0)
MCHC: 33.9 g/dL (ref 31.5–35.7)
MCV: 93 fL (ref 79–97)
Monocytes Absolute: 0.4 10*3/uL (ref 0.1–0.9)
Neutrophils Absolute: 5.3 10*3/uL (ref 1.4–7.0)
Neutrophils: 81 %
Platelets: 166 10*3/uL (ref 150–450)
RBC: 4.1 x10E6/uL (ref 3.77–5.28)
RDW: 12.4 % (ref 11.7–15.4)
RPR Ser Ql: NONREACTIVE
Rh Factor: POSITIVE
Rubella Antibodies, IGG: 1.04 index (ref >0.99)
WBC: 6.6 10*3/uL (ref 3.4–10.8)

## 2020-11-02 LAB — INTEGRATED 1
Crown Rump Length: 62.5 mm
Gest. Age on Collection Date: 12.4 weeks
PAPP-A Value: 720.2 ng/mL
Weight: 128 [lb_av]

## 2020-11-02 LAB — PMP SCREEN PROFILE (10S), URINE
Amphetamine Scrn, Ur: NEGATIVE ng/mL
BARBITURATE SCREEN URINE: NEGATIVE ng/mL
BENZODIAZEPINE SCREEN, URINE: NEGATIVE ng/mL
CANNABINOIDS UR QL SCN: NEGATIVE ng/mL
Cocaine (Metab) Scrn, Ur: NEGATIVE ng/mL
Creatinine(Crt), U: 77 mg/dL (ref 20.0–300.0)
Methadone Screen, Urine: NEGATIVE ng/mL
OXYCODONE+OXYMORPHONE UR QL SCN: NEGATIVE ng/mL
Opiate Scrn, Ur: NEGATIVE ng/mL
Ph of Urine: 7.1 (ref 4.5–8.9)
Phencyclidine Qn, Ur: NEGATIVE ng/mL
Propoxyphene Scrn, Ur: NEGATIVE ng/mL

## 2020-11-02 LAB — URINE CULTURE

## 2020-11-02 LAB — HCV INTERPRETATION

## 2020-11-28 ENCOUNTER — Encounter: Payer: BC Managed Care – PPO | Admitting: Obstetrics & Gynecology

## 2020-12-09 ENCOUNTER — Other Ambulatory Visit: Payer: Self-pay

## 2020-12-09 ENCOUNTER — Encounter: Payer: Self-pay | Admitting: Women's Health

## 2020-12-09 ENCOUNTER — Ambulatory Visit (INDEPENDENT_AMBULATORY_CARE_PROVIDER_SITE_OTHER): Payer: BC Managed Care – PPO | Admitting: Women's Health

## 2020-12-09 VITALS — BP 102/66 | HR 74 | Wt 129.0 lb

## 2020-12-09 DIAGNOSIS — Z1379 Encounter for other screening for genetic and chromosomal anomalies: Secondary | ICD-10-CM | POA: Diagnosis not present

## 2020-12-09 DIAGNOSIS — Z3482 Encounter for supervision of other normal pregnancy, second trimester: Secondary | ICD-10-CM

## 2020-12-09 DIAGNOSIS — Z3480 Encounter for supervision of other normal pregnancy, unspecified trimester: Secondary | ICD-10-CM

## 2020-12-09 DIAGNOSIS — Z363 Encounter for antenatal screening for malformations: Secondary | ICD-10-CM

## 2020-12-09 MED ORDER — PROMETHAZINE HCL 25 MG PO TABS: 12.5000 mg | ORAL_TABLET | Freq: Four times a day (QID) | ORAL | 0 refills | Status: DC | PRN

## 2020-12-09 NOTE — Patient Instructions (Signed)
Claudia Marks, I greatly value your feedback.  If you receive a survey following your visit with [place] today, we appreciate you taking the time to fill it out.  Thanks, Knute Neu, CNM, WHNP-BC  [facility] [place] [facility] ([place], [place] [address]) Entrance C, located off of [address] parking  Go to [email or web address] to register for FREE online childbirth classes   Pediatricians/Family Doctors:  [facility] [place] [phone]                 [place] [phone] (usually not accepting new patients unless you have family there already, you are always welcome to call and ask)       [facility] Department [phone]       [facility] Pediatricians/Family Doctors:   [facility]: [phone]  [facility]: [phone]  [facility]: [place] Doctors:   [facility]: [facility]: [place]:  [place]: [phone]    Home Blood Pressure Monitoring for Patients   Your provider has recommended that you check your blood pressure (BP) at least once a week at home. If you do not have a blood pressure cuff at home, one will be provided for you. Contact your provider if you have not received your monitor within 1 week.   Helpful Tips for Accurate Home Blood Pressure Checks  . Don't smoke, exercise, or drink caffeine 30 minutes before checking your BP . Use the restroom before checking your BP (a full bladder can raise your pressure) . Relax in a comfortable upright chair . Feet on the ground . Left arm resting comfortably on a flat surface at the level of your heart . Legs uncrossed . Back supported . Sit quietly and don't talk . Place the cuff on your bare arm . Adjust snuggly, so  that only two fingertips can fit between your skin and the top of the cuff . Check 2 readings separated by at least one minute . Keep a log of your BP readings . For a visual, please reference this diagram: http://ccnc.care/bpdiagram  Provider Name: [facility]     Phone: [phone]  Zone 1: ALL CLEAR  Continue to monitor your symptoms:  . BP reading is less than 140 (top number) or less than 90 (bottom number)  . No right upper stomach pain . No headaches or seeing spots . No feeling nauseated or throwing up . No swelling in face and hands  Zone 2: CAUTION Call your doctor's office for any of the following:  . BP reading is greater than 140 (top number) or greater than 90 (bottom number)  . Stomach pain under your ribs in the middle or right side . Headaches or seeing spots . Feeling nauseated or throwing up . Swelling in face and hands  Zone 3: EMERGENCY  Seek immediate medical care if you have any of the following:  . BP reading is greater than160 (top number) or greater than 110 (bottom number) . Severe headaches not improving with Tylenol . Serious difficulty catching your breath . Any worsening symptoms from Zone 2     Second Trimester of Pregnancy The second trimester is from week 14 through week 27 (months 4 through 6). The second trimester is often a time when you feel your  best. Your body has adjusted to being pregnant, and you begin to feel better physically. Usually, morning sickness has lessened or quit completely, you may have more energy, and you may have an increase in appetite. The second trimester is also a time when the fetus is growing rapidly. At the end of the sixth month, the fetus is about 9 inches long and weighs about 1 pounds. You will likely begin to feel the baby move (quickening) between 16 and 20 weeks of pregnancy. Body changes during your second trimester Your body continues to go through many changes during your second trimester. The  changes vary from woman to woman.  Your weight will continue to increase. You will notice your lower abdomen bulging out.  You may begin to get stretch marks on your hips, abdomen, and breasts.  You may develop headaches that can be relieved by medicines. The medicines should be approved by your health care provider.  You may urinate more often because the fetus is pressing on your bladder.  You may develop or continue to have heartburn as a result of your pregnancy.  You may develop constipation because certain hormones are causing the muscles that push waste through your intestines to slow down.  You may develop hemorrhoids or swollen, bulging veins (varicose veins).  You may have back pain. This is caused by: ? Weight gain. ? Pregnancy hormones that are relaxing the joints in your pelvis. ? A shift in weight and the muscles that support your balance.  Your breasts will continue to grow and they will continue to become tender.  Your gums may bleed and may be sensitive to brushing and flossing.  Dark spots or blotches (chloasma, mask of pregnancy) may develop on your face. This will likely fade after the baby is born.  A dark line from your belly button to the pubic area (linea nigra) may appear. This will likely fade after the baby is born.  You may have changes in your hair. These can include thickening of your hair, rapid growth, and changes in texture. Some women also have hair loss during or after pregnancy, or hair that feels dry or thin. Your hair will most likely return to normal after your baby is born.  What to expect at prenatal visits During a routine prenatal visit:  You will be weighed to make sure you and the fetus are growing normally.  Your blood pressure will be taken.  Your abdomen will be measured to track your baby's growth.  The fetal heartbeat will be listened to.  Any test results from the previous visit will be discussed.  Your health care  provider may ask you:  How you are feeling.  If you are feeling the baby move.  If you have had any abnormal symptoms, such as leaking fluid, bleeding, severe headaches, or abdominal cramping.  If you are using any tobacco products, including cigarettes, chewing tobacco, and electronic cigarettes.  If you have any questions.  Other tests that may be performed during your second trimester include:  Blood tests that check for: ? Low iron levels (anemia). ? High blood sugar that affects pregnant women (gestational diabetes) between 71 and 28 weeks. ? Rh antibodies. This is to check for a protein on red blood cells (Rh factor).  Urine tests to check for infections, diabetes, or protein in the urine.  An ultrasound to confirm the proper growth and development of the baby.  An amniocentesis to check for possible genetic problems.  Fetal  screens for spina bifida and Down syndrome.  HIV (human immunodeficiency virus) testing. Routine prenatal testing includes screening for HIV, unless you choose not to have this test.  Follow these instructions at home: Medicines  Follow your health care provider's instructions regarding medicine use. Specific medicines may be either safe or unsafe to take during pregnancy.  Take a prenatal vitamin that contains at least 600 micrograms (mcg) of folic acid.  If you develop constipation, try taking a stool softener if your health care provider approves. Eating and drinking  Eat a balanced diet that includes fresh fruits and vegetables, whole grains, good sources of protein such as meat, eggs, or tofu, and low-fat dairy. Your health care provider will help you determine the amount of weight gain that is right for you.  Avoid raw meat and uncooked cheese. These carry germs that can cause birth defects in the baby.  If you have low calcium intake from food, talk to your health care provider about whether you should take a daily calcium  supplement.  Limit foods that are high in fat and processed sugars, such as fried and sweet foods.  To prevent constipation: ? Drink enough fluid to keep your urine clear or pale yellow. ? Eat foods that are high in fiber, such as fresh fruits and vegetables, whole grains, and beans. Activity  Exercise only as directed by your health care provider. Most women can continue their usual exercise routine during pregnancy. Try to exercise for 30 minutes at least 5 days a week. Stop exercising if you experience uterine contractions.  Avoid heavy lifting, wear low heel shoes, and practice good posture.  A sexual relationship may be continued unless your health care provider directs you otherwise. Relieving pain and discomfort  Wear a good support bra to prevent discomfort from breast tenderness.  Take warm sitz baths to soothe any pain or discomfort caused by hemorrhoids. Use hemorrhoid cream if your health care provider approves.  Rest with your legs elevated if you have leg cramps or low back pain.  If you develop varicose veins, wear support hose. Elevate your feet for 15 minutes, 3-4 times a day. Limit salt in your diet. Prenatal Care  Write down your questions. Take them to your prenatal visits.  Keep all your prenatal visits as told by your health care provider. This is important. Safety  Wear your seat belt at all times when driving.  Make a list of emergency phone numbers, including numbers for family, friends, the hospital, and police and fire departments. General instructions  Ask your health care provider for a referral to a local prenatal education class. Begin classes no later than the beginning of month 6 of your pregnancy.  Ask for help if you have counseling or nutritional needs during pregnancy. Your health care provider can offer advice or refer you to specialists for help with various needs.  Do not use hot tubs, steam rooms, or saunas.  Do not douche or use  tampons or scented sanitary pads.  Do not cross your legs for long periods of time.  Avoid cat litter boxes and soil used by cats. These carry germs that can cause birth defects in the baby and possibly loss of the fetus by miscarriage or stillbirth.  Avoid all smoking, herbs, alcohol, and unprescribed drugs. Chemicals in these products can affect the formation and growth of the baby.  Do not use any products that contain nicotine or tobacco, such as cigarettes and e-cigarettes. If you need help  quitting, ask your health care provider.  Visit your dentist if you have not gone yet during your pregnancy. Use a soft toothbrush to brush your teeth and be gentle when you floss. Contact a health care provider if:  You have dizziness.  You have mild pelvic cramps, pelvic pressure, or nagging pain in the abdominal area.  You have persistent nausea, vomiting, or diarrhea.  You have a bad smelling vaginal discharge.  You have pain when you urinate. Get help right away if:  You have a fever.  You are leaking fluid from your vagina.  You have spotting or bleeding from your vagina.  You have severe abdominal cramping or pain.  You have rapid weight gain or weight loss.  You have shortness of breath with chest pain.  You notice sudden or extreme swelling of your face, hands, ankles, feet, or legs.  You have not felt your baby move in over an hour.  You have severe headaches that do not go away when you take medicine.  You have vision changes. Summary  The second trimester is from week 14 through week 27 (months 4 through 6). It is also a time when the fetus is growing rapidly.  Your body goes through many changes during pregnancy. The changes vary from woman to woman.  Avoid all smoking, herbs, alcohol, and unprescribed drugs. These chemicals affect the formation and growth your baby.  Do not use any tobacco products, such as cigarettes, chewing tobacco, and e-cigarettes. If you  need help quitting, ask your health care provider.  Contact your health care provider if you have any questions. Keep all prenatal visits as told by your health care provider. This is important. This information is not intended to replace advice given to you by your health care provider. Make sure you discuss any questions you have with your health care provider. Document Released: 09/01/2001 Document Revised: 02/13/2016 Document Reviewed: 11/08/2012 Elsevier Interactive Patient Education  2017 [employer] Inc[employer]   

## 2020-12-09 NOTE — Progress Notes (Signed)
   LOW-RISK PREGNANCY VISIT Patient name: Claudia Marks MRN [id number]  Date of birth: 1996/07/03 Chief Complaint:   Routine Prenatal Visit  History of Present Illness:   Claudia Marks is a 25 y.o. G37P1001 female at [redacted]w[redacted]d with an Estimated Date of Delivery: 05/10/21 being seen today for ongoing management of a low-risk pregnancy.  Depression screen [facility] 2/9 10/31/2020 06/06/2018  Decreased Interest 2 0  Down, Depressed, Hopeless 1 0  PHQ - 2 Score 3 0  Altered sleeping 1 -  Tired, decreased energy 3 -  Change in appetite 3 -  Feeling bad or failure about yourself  0 -  Trouble concentrating 2 -  Moving slowly or fidgety/restless 0 -  Suicidal thoughts 0 -  PHQ-9 Score 12 -    Today she reports still having some vomiting despite diclegis and zofran. Contractions: Irritability. Vag. Bleeding: None.  Movement: Present. denies leaking of fluid. Review of Systems:   Pertinent items are noted in HPI Denies abnormal vaginal discharge w/ itching/odor/irritation, headaches, visual changes, shortness of breath, chest pain, abdominal pain, severe nausea/vomiting, or problems with urination or bowel movements unless otherwise stated above. Pertinent History Reviewed:  Reviewed past medical,surgical, social, obstetrical and family history.  Reviewed problem list, medications and allergies. Physical Assessment:   Vitals:   12/09/20 1330  BP: 102/66  Pulse: 74  Weight: 129 lb (58.5 kg)  Body mass index is 23.59 kg/m.        Physical Examination:   General appearance: Well appearing, and in no distress  Mental status: Alert, oriented to person, place, and time  Skin: Warm & dry  Cardiovascular: Normal heart rate noted  Respiratory: Normal respiratory effort, no distress  Abdomen: Soft, gravid, nontender  Pelvic: Cervical exam deferred         Extremities: Edema: None  Fetal Status: Fetal Heart Rate (bpm): 150   Movement: Present    Chaperone: N/A   No results found for this or any  previous visit (from the past 24 hour(s)).  Assessment & Plan:  1) Low-risk pregnancy G2P1001 at [redacted]w[redacted]d with an Estimated Date of Delivery: 05/10/21   2) N/V, rx phenergan   Meds:  Meds ordered this encounter  Medications  . promethazine (PHENERGAN) 25 MG tablet    Sig: Take 0.5-1 tablets (12.5-25 mg total) by mouth every 6 (six) hours as needed for nausea or vomiting.    Dispense:  30 tablet    Refill:  0    Order Specific Question:   Supervising Provider    Answer:   Florian Buff [[id number]]   Labs/procedures today: none  Plan:  Continue routine obstetrical care  Next visit: prefers will be in person for u/s    Reviewed: Preterm labor symptoms and general obstetric precautions including but not limited to vaginal bleeding, contractions, leaking of fluid and fetal movement were reviewed in detail with the patient.  All questions were answered. Has home bp cuff.  Check bp weekly, let us know if >140/90.   Follow-up: Return for ASAP, CB:ULAGTXM (no visit), then 4wks for LROB w/ CNM (online or in person).  No future appointments.  Orders Placed This Encounter  Procedures  . US OB Comp + 14 Wk  . INTEGRATED 2   [place], [facility] 12/09/2020 1:47 PM

## 2020-12-11 LAB — INTEGRATED 2
AFP MoM: 1.37
Alpha-Fetoprotein: 62.7 ng/mL
Crown Rump Length: 62.5 mm
DIA MoM: 0.94
DIA Value: 164.2 pg/mL
Estriol, Unconjugated: 1.6 ng/mL
Gest. Age on Collection Date: 12.4 weeks
Gestational Age: 18 weeks
Maternal Age at EDD: 24.1 yr
Nuchal Translucency (NT): 1.5 mm
Nuchal Translucency MoM: 1.07
Number of Fetuses: 1
PAPP-A MoM: 0.62
PAPP-A Value: 720.2 ng/mL
Test Results:: NEGATIVE
Weight: 128 [lb_av]
Weight: 128 [lb_av]
hCG MoM: 1.27
hCG Value: 35.9 IU/mL
uE3 MoM: 1.07

## 2020-12-19 ENCOUNTER — Ambulatory Visit (INDEPENDENT_AMBULATORY_CARE_PROVIDER_SITE_OTHER): Payer: BC Managed Care – PPO

## 2020-12-19 ENCOUNTER — Other Ambulatory Visit: Payer: Self-pay

## 2020-12-19 DIAGNOSIS — Z3A19 19 weeks gestation of pregnancy: Secondary | ICD-10-CM

## 2020-12-19 DIAGNOSIS — Z3480 Encounter for supervision of other normal pregnancy, unspecified trimester: Secondary | ICD-10-CM

## 2020-12-19 DIAGNOSIS — Z363 Encounter for antenatal screening for malformations: Secondary | ICD-10-CM

## 2020-12-19 DIAGNOSIS — Z3482 Encounter for supervision of other normal pregnancy, second trimester: Secondary | ICD-10-CM | POA: Diagnosis not present

## 2020-12-19 NOTE — Progress Notes (Signed)
[place] 44+5 wks,cephalic,posterior placenta gr 0,normal ovaries,cx 3.7 cm,svp of fluid 3.7 cm,fhr 144 bpm,EFW 296 g 33%,anatomy complete,no obvious abnormalities

## 2021-01-07 ENCOUNTER — Other Ambulatory Visit: Payer: Self-pay

## 2021-01-07 ENCOUNTER — Ambulatory Visit (INDEPENDENT_AMBULATORY_CARE_PROVIDER_SITE_OTHER): Payer: BC Managed Care – PPO | Admitting: Women's Health

## 2021-01-07 ENCOUNTER — Encounter: Payer: Self-pay | Admitting: Women's Health

## 2021-01-07 VITALS — BP 106/67 | HR 82 | Wt 132.0 lb

## 2021-01-07 DIAGNOSIS — Z3480 Encounter for supervision of other normal pregnancy, unspecified trimester: Secondary | ICD-10-CM

## 2021-01-07 DIAGNOSIS — Z3482 Encounter for supervision of other normal pregnancy, second trimester: Secondary | ICD-10-CM

## 2021-01-07 DIAGNOSIS — R42 Dizziness and giddiness: Secondary | ICD-10-CM | POA: Diagnosis not present

## 2021-01-07 DIAGNOSIS — Z3A22 22 weeks gestation of pregnancy: Secondary | ICD-10-CM

## 2021-01-07 LAB — POCT HEMOGLOBIN: Hemoglobin: 12 g/dL (ref 11–14.6)

## 2021-01-07 NOTE — Progress Notes (Signed)
   LOW-RISK PREGNANCY VISIT Patient name: Claudia Marks MRN [id number]  Date of birth: 01-May-1996 Chief Complaint:   Routine Prenatal Visit  History of Present Illness:   Claudia Marks is a 25 y.o. G58P1001 female at [redacted]w[redacted]d with an Estimated Date of Delivery: 05/10/21 being seen today for ongoing management of a low-risk pregnancy.  Depression screen [facility] 2/9 10/31/2020 06/06/2018  Decreased Interest 2 0  Down, Depressed, Hopeless 1 0  PHQ - 2 Score 3 0  Altered sleeping 1 -  Tired, decreased energy 3 -  Change in appetite 3 -  Feeling bad or failure about yourself  0 -  Trouble concentrating 2 -  Moving slowly or fidgety/restless 0 -  Suicidal thoughts 0 -  PHQ-9 Score 12 -    Today she reports some dizzy spells. Contractions: Not present. Vag. Bleeding: None.  Movement: Present. denies leaking of fluid. Review of Systems:   Pertinent items are noted in HPI Denies abnormal vaginal discharge w/ itching/odor/irritation, headaches, visual changes, shortness of breath, chest pain, abdominal pain, severe nausea/vomiting, or problems with urination or bowel movements unless otherwise stated above. Pertinent History Reviewed:  Reviewed past medical,surgical, social, obstetrical and family history.  Reviewed problem list, medications and allergies. Physical Assessment:   Vitals:   01/07/21 1043  BP: 106/67  Pulse: 82  Weight: 132 lb (59.9 kg)  Body mass index is 24.14 kg/m.        Physical Examination:   General appearance: Well appearing, and in no distress  Mental status: Alert, oriented to person, place, and time  Skin: Warm & dry  Cardiovascular: Normal heart rate noted  Respiratory: Normal respiratory effort, no distress  Abdomen: Soft, gravid, nontender  Pelvic: Cervical exam deferred         Extremities: Edema: None  Fetal Status: Fetal Heart Rate (bpm): 150 Fundal Height: 20 cm Movement: Present    Chaperone: N/A   Results for orders placed or performed in visit on  01/07/21 (from the past 24 hour(s))  POCT hemoglobin   Collection Time: 01/07/21 11:02 AM  Result Value Ref Range   Hemoglobin 12.0 11 - 14.6 g/dL    Assessment & Plan:  1) Low-risk pregnancy G2P1001 at [redacted]w[redacted]d with an Estimated Date of Delivery: 05/10/21   2) Dizzy spells, hgb fingerstick 12.0, gave printed prevention/relief measures    Meds: No orders of the defined types were placed in this encounter.  Labs/procedures today: fingerstick hgb  Plan:  Continue routine obstetrical care  Next visit: prefers will be in person for pn2    Reviewed: Preterm labor symptoms and general obstetric precautions including but not limited to vaginal bleeding, contractions, leaking of fluid and fetal movement were reviewed in detail with the patient.  All questions were answered. Does have home bp cuff. Office bp cuff given: not applicable. Check bp weekly, let us know if consistently >140 and/or >90.  Follow-up: Return in about 4 weeks (around 02/04/2021) for LROB, PN2, CNM, in person.  Future Appointments  Date Time Provider [place]  02/04/2021  8:30 AM [facility]  02/04/2021  9:30 AM Roma Schanz, CNM [facility]    Orders Placed This Encounter  Procedures  . POCT hemoglobin   Roma Schanz CNM, [facility] 01/07/2021 11:18 AM

## 2021-01-07 NOTE — Patient Instructions (Addendum)
Claudia Marks, I greatly value your feedback.  If you receive a survey following your visit with [place] today, we appreciate you taking the time to fill it out.  Thanks, Knute Neu, CNM, WHNP-BC   You will have your sugar test next visit.  Please do not eat or drink anything after midnight the night before you come, not even water.  You will be here for at least two hours.  Please make an appointment online for the bloodwork at [email or web address] for 8:30am (or as close to this as possible). Make sure you select the [facility] service center. The day of the appointment, check in with our office first, then you will go to [facility] to start the sugar test.    [place] at [facility] [place] [place] [address]) Entrance C, located off of [place] parking   For [place]:   This is usually related to either your blood sugar or your blood pressure dropping  Make sure you are staying well hydrated and drinking enough water so that your urine is clear  Eat small frequent meals and snacks containing protein (meat, eggs, nuts, cheese) so that your blood sugar doesn't drop  If you do get dizzy, sit/lay down and get you something to drink and a snack containing protein- you will usually start feeling better in 10-20 minutes    Go to [email or web address] to register for FREE online childbirth classes   Call the office [phone]) or go to [facility] if:  You begin to have strong, frequent contractions  Your water breaks.  Sometimes it is a big gush of fluid, sometimes it is just a trickle that keeps getting your panties wet or running down your legs  You have vaginal bleeding.  It is normal to have a small amount of spotting if your cervix was checked.   You don't feel your baby moving like normal.  If you don't, get you something to eat and drink and lay down and focus on feeling your baby move.   If your baby is still not moving like normal, you  should call the office or go to [place] [facility]/Family Doctors:  [place] [phone]                 [place] [phone] (usually not accepting new patients unless you have family there already, you are always welcome to call and ask)       [facility] [phone]       [facility] Pediatricians/Family Doctors:   [facility]: [phone]  [facility]: [phone]  [facility]: [place] Doctors:   [facility]: [place] [facility]: [place]:  [place]: [phone]   Home Blood Pressure Monitoring for Patients   Your provider has recommended that you check your blood pressure (BP) at least once a week at home. If you do not have a blood pressure cuff at home, one will be provided for you. Contact your provider if you have not received your monitor within 1 week.   Helpful Tips for Accurate Home Blood Pressure Checks  . Don't smoke, exercise, or drink caffeine 30 minutes before checking your BP . Use the restroom before checking your BP (a full bladder can raise your pressure) .  Relax in a comfortable upright chair . Feet on the ground . Left arm resting comfortably on a flat surface at the level of your heart . Legs uncrossed . Back supported . Sit quietly and don't talk . Place the cuff on your bare arm . Adjust snuggly, so that only two fingertips can fit between your skin and the top of the cuff . Check 2 readings separated by at least one minute . Keep a log of your BP readings . For a visual, please reference this diagram: http://ccnc.care/bpdiagram  Provider Name: [facility]     Phone: [phone]  Zone 1: ALL CLEAR  Continue to monitor your symptoms:  . BP reading is  less than 140 (top number) or less than 90 (bottom number)  . No right upper stomach pain . No headaches or seeing spots . No feeling nauseated or throwing up . No swelling in face and hands  Zone 2: CAUTION Call your doctor's office for any of the following:  . BP reading is greater than 140 (top number) or greater than 90 (bottom number)  . Stomach pain under your ribs in the middle or right side . Headaches or seeing spots . Feeling nauseated or throwing up . Swelling in face and hands  Zone 3: EMERGENCY  Seek immediate medical care if you have any of the following:  . BP reading is greater than160 (top number) or greater than 110 (bottom number) . Severe headaches not improving with Tylenol . Serious difficulty catching your breath . Any worsening symptoms from Zone 2   Second Trimester of Pregnancy The second trimester is from week 13 through week 28, months 4 through 6. The second trimester is often a time when you feel your best. Your body has also adjusted to being pregnant, and you begin to feel better physically. Usually, morning sickness has lessened or quit completely, you may have more energy, and you may have an increase in appetite. The second trimester is also a time when the fetus is growing rapidly. At the end of the sixth month, the fetus is about 9 inches long and weighs about 1 pounds. You will likely begin to feel the baby move (quickening) between 18 and 20 weeks of the pregnancy. BODY CHANGES Your body goes through many changes during pregnancy. The changes vary from woman to woman.   Your weight will continue to increase. You will notice your lower abdomen bulging out.  You may begin to get stretch marks on your hips, abdomen, and breasts.  You may develop headaches that can be relieved by medicines approved by your health care provider.  You may urinate more often because the fetus is pressing on your bladder.  You may develop or continue to have heartburn  as a result of your pregnancy.  You may develop constipation because certain hormones are causing the muscles that push waste through your intestines to slow down.  You may develop hemorrhoids or swollen, bulging veins (varicose veins).  You may have back pain because of the weight gain and pregnancy hormones relaxing your joints between the bones in your pelvis and as a result of a shift in weight and the muscles that support your balance.  Your breasts will continue to grow and be tender.  Your gums may bleed and may be sensitive to brushing and flossing.  Dark spots or blotches (chloasma, mask of pregnancy) may develop on your face. This will likely fade after the baby is born.  A  dark line from your belly button to the pubic area (linea nigra) may appear. This will likely fade after the baby is born.  You may have changes in your hair. These can include thickening of your hair, rapid growth, and changes in texture. Some women also have hair loss during or after pregnancy, or hair that feels dry or thin. Your hair will most likely return to normal after your baby is born. WHAT TO EXPECT AT YOUR PRENATAL VISITS During a routine prenatal visit:  You will be weighed to make sure you and the fetus are growing normally.  Your blood pressure will be taken.  Your abdomen will be measured to track your baby's growth.  The fetal heartbeat will be listened to.  Any test results from the previous visit will be discussed. Your health care provider may ask you:  How you are feeling.  If you are feeling the baby move.  If you have had any abnormal symptoms, such as leaking fluid, bleeding, severe headaches, or abdominal cramping.  If you have any questions. Other tests that may be performed during your second trimester include:  Blood tests that check for:  Low iron levels (anemia).  Gestational diabetes (between 24 and 28 weeks).  Rh antibodies.  Urine tests to check for  infections, diabetes, or protein in the urine.  An ultrasound to confirm the proper growth and development of the baby.  An amniocentesis to check for possible genetic problems.  Fetal screens for spina bifida and Down syndrome. HOME CARE INSTRUCTIONS   Avoid all smoking, herbs, alcohol, and unprescribed drugs. These chemicals affect the formation and growth of the baby.  Follow your health care provider's instructions regarding medicine use. There are medicines that are either safe or unsafe to take during pregnancy.  Exercise only as directed by your health care provider. Experiencing uterine cramps is a good sign to stop exercising.  Continue to eat regular, healthy meals.  Wear a good support bra for breast tenderness.  Do not use hot tubs, steam rooms, or saunas.  Wear your seat belt at all times when driving.  Avoid raw meat, uncooked cheese, cat litter boxes, and soil used by cats. These carry germs that can cause birth defects in the baby.  Take your prenatal vitamins.  Try taking a stool softener (if your health care provider approves) if you develop constipation. Eat more high-fiber foods, such as fresh vegetables or fruit and whole grains. Drink plenty of fluids to keep your urine clear or pale yellow.  Take warm sitz baths to soothe any pain or discomfort caused by hemorrhoids. Use hemorrhoid cream if your health care provider approves.  If you develop varicose veins, wear support hose. Elevate your feet for 15 minutes, 3-4 times a day. Limit salt in your diet.  Avoid heavy lifting, wear low heel shoes, and practice good posture.  Rest with your legs elevated if you have leg cramps or low back pain.  Visit your dentist if you have not gone yet during your pregnancy. Use a soft toothbrush to brush your teeth and be gentle when you floss.  A sexual relationship may be continued unless your health care provider directs you otherwise.  Continue to go to all your  prenatal visits as directed by your health care provider. SEEK MEDICAL CARE IF:   You have dizziness.  You have mild pelvic cramps, pelvic pressure, or nagging pain in the abdominal area.  You have persistent nausea, vomiting, or diarrhea.  You  have a bad smelling vaginal discharge.  You have pain with urination. SEEK IMMEDIATE MEDICAL CARE IF:   You have a fever.  You are leaking fluid from your vagina.  You have spotting or bleeding from your vagina.  You have severe abdominal cramping or pain.  You have rapid weight gain or loss.  You have shortness of breath with chest pain.  You notice sudden or extreme swelling of your face, hands, ankles, feet, or legs.  You have not felt your baby move in over an hour.  You have severe headaches that do not go away with medicine.  You have vision changes. Document Released: 09/01/2001 Document Revised: 09/12/2013 Document Reviewed: 11/08/2012 [facility] Patient Information 2015 [place], [place]. This information is not intended to replace advice given to you by your health care provider. Make sure you discuss any questions you have with your health care provider.

## 2021-01-31 ENCOUNTER — Other Ambulatory Visit: Payer: BC Managed Care – PPO

## 2021-01-31 ENCOUNTER — Ambulatory Visit (INDEPENDENT_AMBULATORY_CARE_PROVIDER_SITE_OTHER): Payer: BC Managed Care – PPO | Admitting: Women's Health

## 2021-01-31 ENCOUNTER — Other Ambulatory Visit: Payer: Self-pay

## 2021-01-31 ENCOUNTER — Encounter: Payer: Self-pay | Admitting: Women's Health

## 2021-01-31 VITALS — BP 109/73 | HR 93 | Wt 135.0 lb

## 2021-01-31 DIAGNOSIS — Z3480 Encounter for supervision of other normal pregnancy, unspecified trimester: Secondary | ICD-10-CM | POA: Diagnosis not present

## 2021-01-31 DIAGNOSIS — Z3A25 25 weeks gestation of pregnancy: Secondary | ICD-10-CM | POA: Diagnosis not present

## 2021-01-31 DIAGNOSIS — Z131 Encounter for screening for diabetes mellitus: Secondary | ICD-10-CM

## 2021-01-31 DIAGNOSIS — Z3482 Encounter for supervision of other normal pregnancy, second trimester: Secondary | ICD-10-CM

## 2021-01-31 DIAGNOSIS — Z23 Encounter for immunization: Secondary | ICD-10-CM | POA: Diagnosis not present

## 2021-01-31 NOTE — Patient Instructions (Signed)
Claudia Marks, I greatly value your feedback.  If you receive a survey following your visit with [place] today, we appreciate you taking the time to fill it out.  Thanks, Knute Neu, CNM, WHNP-BC   [facility] & [place] at [facility], [place] [address]) Entrance C, located off of [place] parking  Go to [email or web address] to register for FREE online childbirth classes   Call the office [phone]) or go to [facility] if:  You begin to have strong, frequent contractions  Your water breaks.  Sometimes it is a big gush of fluid, sometimes it is just a trickle that keeps getting your panties wet or running down your legs  You have vaginal bleeding.  It is normal to have a small amount of spotting if your cervix was checked.   You don't feel your baby moving like normal.  If you don't, get you something to eat and drink and lay down and focus on feeling your baby move.  You should feel at least 10 movements in 2 hours.  If you don't, you should call the office or go to [facility].    Tdap Vaccine  It is recommended that you get the Tdap vaccine during the third trimester of EACH pregnancy to help protect your baby from getting pertussis (whooping cough)  27-36 weeks is the BEST time to do this so that you can pass the protection on to your baby. During pregnancy is better than after pregnancy, but if you are unable to get it during pregnancy it will be offered at the hospital.   You can get this vaccine with [place], at the health department, your family doctor, or some local pharmacies  Everyone who will be around your baby should also be up-to-date on their vaccines before the baby comes. Adults (who are not pregnant) only need 1 dose of Tdap during adulthood.   [place] Pediatricians/Family Doctors:  [facility] [phone]                 [place]  [phone] (usually not accepting new patients unless you have family there already, you are always welcome to call and ask)       [facility] Department [phone]       [facility] Pediatricians/Family Doctors:   [facility]: [phone]  [facility]: [phone]  [facility]: [place] Doctors:   [facility]: [place] [facility]: [place]:  [place]: [phone]   Home Blood Pressure Monitoring for Patients   Your provider has recommended that you check your blood pressure (BP) at least once a week at home. If you do not have a blood pressure cuff at home, one will be provided for you. Contact your provider if you have not received your monitor within 1 week.   Helpful Tips for Accurate Home Blood Pressure Checks  . Don't smoke, exercise, or drink caffeine 30 minutes before checking your BP . Use the restroom before checking your BP (a full bladder can raise your pressure) . Relax in a comfortable upright chair . Feet on the ground . Left arm resting comfortably on a flat surface at the level of your heart . Legs uncrossed . Back supported . Sit quietly and don't talk . Place the cuff on your  bare arm . Adjust snuggly, so that only two fingertips can fit between your skin and the top of the cuff . Check 2 readings separated by at least one minute . Keep a log of your BP readings . For a visual, please reference this diagram: http://ccnc.care/bpdiagram  Provider Name: [facility]     Phone: [phone]  Zone 1: ALL CLEAR  Continue to monitor your symptoms:  . BP reading is less than 140 (top number) or less than 90 (bottom number)  . No right upper stomach pain . No headaches or seeing spots . No feeling nauseated or throwing up . No swelling in face and hands  Zone 2: CAUTION Call your  doctor's office for any of the following:  . BP reading is greater than 140 (top number) or greater than 90 (bottom number)  . Stomach pain under your ribs in the middle or right side . Headaches or seeing spots . Feeling nauseated or throwing up . Swelling in face and hands  Zone 3: EMERGENCY  Seek immediate medical care if you have any of the following:  . BP reading is greater than160 (top number) or greater than 110 (bottom number) . Severe headaches not improving with Tylenol . Serious difficulty catching your breath . Any worsening symptoms from Zone 2   Third Trimester of Pregnancy The third trimester is from week 29 through week 42, months 7 through 9. The third trimester is a time when the fetus is growing rapidly. At the end of the ninth month, the fetus is about 20 inches in length and weighs 6-10 pounds.  BODY CHANGES Your body goes through many changes during pregnancy. The changes vary from woman to woman.   Your weight will continue to increase. You can expect to gain 25-35 pounds (11-16 kg) by the end of the pregnancy.  You may begin to get stretch marks on your hips, abdomen, and breasts.  You may urinate more often because the fetus is moving lower into your pelvis and pressing on your bladder.  You may develop or continue to have heartburn as a result of your pregnancy.  You may develop constipation because certain hormones are causing the muscles that push waste through your intestines to slow down.  You may develop hemorrhoids or swollen, bulging veins (varicose veins).  You may have pelvic pain because of the weight gain and pregnancy hormones relaxing your joints between the bones in your pelvis. Backaches may result from overexertion of the muscles supporting your posture.  You may have changes in your hair. These can include thickening of your hair, rapid growth, and changes in texture. Some women also have hair loss during or after pregnancy, or hair that  feels dry or thin. Your hair will most likely return to normal after your baby is born.  Your breasts will continue to grow and be tender. A yellow discharge may leak from your breasts called colostrum.  Your belly button may stick out.  You may feel short of breath because of your expanding uterus.  You may notice the fetus "dropping," or moving lower in your abdomen.  You may have a bloody mucus discharge. This usually occurs a few days to a week before labor begins.  Your cervix becomes thin and soft (effaced) near your due date. WHAT TO EXPECT AT YOUR PRENATAL EXAMS  You will have prenatal exams every 2 weeks until week 36. Then, you will have weekly prenatal exams. During a routine prenatal visit:  You will be weighed to make sure you and the fetus are growing normally.  Your blood pressure is taken.  Your abdomen will be measured to track your baby's growth.  The fetal heartbeat will be listened to.  Any test results from the previous visit will be discussed.  You may have a cervical check near your due date to see if you have effaced. At around 36 weeks, your caregiver will check your cervix. At the same time, your caregiver will also perform a test on the secretions of the vaginal tissue. This test is to determine if a type of bacteria, Group B streptococcus, is present. Your caregiver will explain this further. Your caregiver may ask you:  What your birth plan is.  How you are feeling.  If you are feeling the baby move.  If you have had any abnormal symptoms, such as leaking fluid, bleeding, severe headaches, or abdominal cramping.  If you have any questions. Other tests or screenings that may be performed during your third trimester include:  Blood tests that check for low iron levels (anemia).  Fetal testing to check the health, activity level, and growth of the fetus. Testing is done if you have certain medical conditions or if there are problems during the  pregnancy. FALSE LABOR You may feel small, irregular contractions that eventually go away. These are called Braxton Hicks contractions, or false labor. Contractions may last for hours, days, or even weeks before true labor sets in. If contractions come at regular intervals, intensify, or become painful, it is best to be seen by your caregiver.  SIGNS OF LABOR   Menstrual-like cramps.  Contractions that are 5 minutes apart or less.  Contractions that start on the top of the uterus and spread down to the lower abdomen and back.  A sense of increased pelvic pressure or back pain.  A watery or bloody mucus discharge that comes from the vagina. If you have any of these signs before the 37th week of pregnancy, call your caregiver right away. You need to go to the hospital to get checked immediately. HOME CARE INSTRUCTIONS   Avoid all smoking, herbs, alcohol, and unprescribed drugs. These chemicals affect the formation and growth of the baby.  Follow your caregiver's instructions regarding medicine use. There are medicines that are either safe or unsafe to take during pregnancy.  Exercise only as directed by your caregiver. Experiencing uterine cramps is a good sign to stop exercising.  Continue to eat regular, healthy meals.  Wear a good support bra for breast tenderness.  Do not use hot tubs, steam rooms, or saunas.  Wear your seat belt at all times when driving.  Avoid raw meat, uncooked cheese, cat litter boxes, and soil used by cats. These carry germs that can cause birth defects in the baby.  Take your prenatal vitamins.  Try taking a stool softener (if your caregiver approves) if you develop constipation. Eat more high-fiber foods, such as fresh vegetables or fruit and whole grains. Drink plenty of fluids to keep your urine clear or pale yellow.  Take warm sitz baths to soothe any pain or discomfort caused by hemorrhoids. Use hemorrhoid cream if your caregiver approves.  If you  develop varicose veins, wear support hose. Elevate your feet for 15 minutes, 3-4 times a day. Limit salt in your diet.  Avoid heavy lifting, wear low heal shoes, and practice good posture.  Rest a lot with your legs elevated if you have leg cramps or low  back pain.  Visit your dentist if you have not gone during your pregnancy. Use a soft toothbrush to brush your teeth and be gentle when you floss.  A sexual relationship may be continued unless your caregiver directs you otherwise.  Do not travel far distances unless it is absolutely necessary and only with the approval of your caregiver.  Take prenatal classes to understand, practice, and ask questions about the labor and delivery.  Make a trial run to the hospital.  Pack your hospital bag.  Prepare the baby's nursery.  Continue to go to all your prenatal visits as directed by your caregiver. SEEK MEDICAL CARE IF:  You are unsure if you are in labor or if your water has broken.  You have dizziness.  You have mild pelvic cramps, pelvic pressure, or nagging pain in your abdominal area.  You have persistent nausea, vomiting, or diarrhea.  You have a bad smelling vaginal discharge.  You have pain with urination. SEEK IMMEDIATE MEDICAL CARE IF:   You have a fever.  You are leaking fluid from your vagina.  You have spotting or bleeding from your vagina.  You have severe abdominal cramping or pain.  You have rapid weight loss or gain.  You have shortness of breath with chest pain.  You notice sudden or extreme swelling of your face, hands, ankles, feet, or legs.  You have not felt your baby move in over an hour.  You have severe headaches that do not go away with medicine.  You have vision changes. Document Released: 09/01/2001 Document Revised: 09/12/2013 Document Reviewed: 11/08/2012 [facility] Patient Information 2015 [place], [place]. This information is not intended to replace advice given to you by your health  care provider. Make sure you discuss any questions you have with your health care provider.

## 2021-01-31 NOTE — Progress Notes (Signed)
   LOW-RISK PREGNANCY VISIT Patient name: Claudia Marks MRN [id number]  Date of birth: 1996-07-29 Chief Complaint:   Routine Prenatal Visit  History of Present Illness:   Claudia Marks is a 25 y.o. G50P1001 female at [redacted]w[redacted]d with an Estimated Date of Delivery: 05/10/21 being seen today for ongoing management of a low-risk pregnancy.  Depression screen [facility] 2/9 01/31/2021 10/31/2020 06/06/2018  Decreased Interest 0 2 0  Down, Depressed, Hopeless 0 1 0  PHQ - 2 Score 0 3 0  Altered sleeping 1 1 -  Tired, decreased energy 1 3 -  Change in appetite 0 3 -  Feeling bad or failure about yourself  0 0 -  Trouble concentrating 0 2 -  Moving slowly or fidgety/restless 0 0 -  Suicidal thoughts 0 0 -  PHQ-9 Score 2 12 -    Today she reports no complaints. Contractions: Not present. Vag. Bleeding: None.  Movement: Present. denies leaking of fluid. Review of Systems:   Pertinent items are noted in HPI Denies abnormal vaginal discharge w/ itching/odor/irritation, headaches, visual changes, shortness of breath, chest pain, abdominal pain, severe nausea/vomiting, or problems with urination or bowel movements unless otherwise stated above. Pertinent History Reviewed:  Reviewed past medical,surgical, social, obstetrical and family history.  Reviewed problem list, medications and allergies. Physical Assessment:   Vitals:   01/31/21 1011  BP: 109/73  Pulse: 93  Weight: 135 lb (61.2 kg)  Body mass index is 24.69 kg/m.        Physical Examination:   General appearance: Well appearing, and in no distress  Mental status: Alert, oriented to person, place, and time  Skin: Warm & dry  Cardiovascular: Normal heart rate noted  Respiratory: Normal respiratory effort, no distress  Abdomen: Soft, gravid, nontender  Pelvic: Cervical exam deferred         Extremities: Edema: None  Fetal Status: Fetal Heart Rate (bpm): 145 Fundal Height: 24 cm Movement: Present    Chaperone: N/A   No results found for this  or any previous visit (from the past 24 hour(s)).  Assessment & Plan:  1) Low-risk pregnancy G2P1001 at [redacted]w[redacted]d with an Estimated Date of Delivery: 05/10/21    Meds: No orders of the defined types were placed in this encounter.  Labs/procedures today: tdap and PN2  Plan:  Continue routine obstetrical care  Next visit: prefers in person    Reviewed: Preterm labor symptoms and general obstetric precautions including but not limited to vaginal bleeding, contractions, leaking of fluid and fetal movement were reviewed in detail with the patient.  All questions were answered. Does have home bp cuff. Office bp cuff given: not applicable. Check bp weekly, let us know if consistently >140 and/or >90.  Follow-up: Return in about 4 weeks (around 02/28/2021) for [place], [place], in person.  Future Appointments  Date Time Provider [place]  02/28/2021  9:10 AM Roma Schanz, CNM [facility]    Orders Placed This Encounter  Procedures  . Tdap vaccine greater than or equal to 7yo IM   Roma Schanz CNM, [facility] 01/31/2021 10:33 AM

## 2021-02-01 LAB — GLUCOSE TOLERANCE, 2 HOURS W/ 1HR: Glucose, 1 hour: 107 mg/dL (ref 65–179)

## 2021-02-01 LAB — CBC: RBC: 3.44 x10E6/uL — ABNORMAL LOW (ref 3.77–5.28)

## 2021-02-03 ENCOUNTER — Encounter: Payer: Self-pay | Admitting: Women's Health

## 2021-02-03 DIAGNOSIS — D696 Thrombocytopenia, unspecified: Secondary | ICD-10-CM | POA: Insufficient documentation

## 2021-02-03 DIAGNOSIS — O99119 Other diseases of the blood and blood-forming organs and certain disorders involving the immune mechanism complicating pregnancy, unspecified trimester: Secondary | ICD-10-CM | POA: Insufficient documentation

## 2021-02-04 ENCOUNTER — Encounter: Payer: BC Managed Care – PPO | Admitting: Women's Health

## 2021-02-04 LAB — CBC
Hematocrit: 32.3 % — ABNORMAL LOW (ref 34.0–46.6)
Hemoglobin: 11.3 g/dL (ref 11.1–15.9)
MCH: 32.8 pg (ref 26.6–33.0)
MCHC: 35 g/dL (ref 31.5–35.7)
MCV: 94 fL (ref 79–97)
Platelets: 118 10*3/uL — ABNORMAL LOW (ref 150–450)
RDW: 11.8 % (ref 11.7–15.4)
WBC: 6.7 10*3/uL (ref 3.4–10.8)

## 2021-02-04 LAB — RPR: RPR Ser Ql: NONREACTIVE

## 2021-02-04 LAB — GLUCOSE TOLERANCE, 2 HOURS W/ 1HR
Glucose, 2 hour: 122 mg/dL (ref 65–152)
Glucose, Fasting: 84 mg/dL (ref 65–91)

## 2021-02-04 LAB — ANTIBODY SCREEN: Antibody Screen: NEGATIVE

## 2021-02-04 LAB — HIV ANTIBODY (ROUTINE TESTING W REFLEX): HIV Screen 4th Generation wRfx: NONREACTIVE

## 2021-02-28 ENCOUNTER — Other Ambulatory Visit: Payer: Self-pay

## 2021-02-28 ENCOUNTER — Encounter: Payer: Self-pay | Admitting: Women's Health

## 2021-02-28 ENCOUNTER — Ambulatory Visit (INDEPENDENT_AMBULATORY_CARE_PROVIDER_SITE_OTHER): Payer: BC Managed Care – PPO | Admitting: Women's Health

## 2021-02-28 VITALS — BP 111/73 | HR 101 | Wt 134.2 lb

## 2021-02-28 DIAGNOSIS — O99119 Other diseases of the blood and blood-forming organs and certain disorders involving the immune mechanism complicating pregnancy, unspecified trimester: Secondary | ICD-10-CM | POA: Diagnosis not present

## 2021-02-28 DIAGNOSIS — Z3483 Encounter for supervision of other normal pregnancy, third trimester: Secondary | ICD-10-CM | POA: Diagnosis not present

## 2021-02-28 DIAGNOSIS — D696 Thrombocytopenia, unspecified: Secondary | ICD-10-CM

## 2021-02-28 NOTE — Progress Notes (Signed)
LOW-RISK PREGNANCY VISIT Patient name: Claudia Marks MRN [id number]  Date of birth: May 07, 1996 Chief Complaint:   Routine Prenatal Visit  History of Present Illness:   Claudia Marks is a 25 y.o. G56P1001 female at [redacted]w[redacted]d with an Estimated Date of Delivery: 05/10/21 being seen today for ongoing management of a low-risk pregnancy.   Today she reports  leg cramps at night and back spasms yesterday . Concerned b/c not really gaining weight, 2lb wt gain entire pregnancy. No n/v/d. Eating well. Contractions: Not present. Vag. Bleeding: None.  Movement: Present. denies leaking of fluid.  Depression screen [facility] 2/9 01/31/2021 10/31/2020 06/06/2018  Decreased Interest 0 2 0  Down, Depressed, Hopeless 0 1 0  PHQ - 2 Score 0 3 0  Altered sleeping 1 1 -  Tired, decreased energy 1 3 -  Change in appetite 0 3 -  Feeling bad or failure about yourself  0 0 -  Trouble concentrating 0 2 -  Moving slowly or fidgety/restless 0 0 -  Suicidal thoughts 0 0 -  PHQ-9 Score 2 12 -     GAD 7 : Generalized Anxiety Score 01/31/2021 10/31/2020  Nervous, Anxious, on Edge 1 1  Control/stop worrying 0 1  Worry too much - different things 0 1  Trouble relaxing 0 1  Restless 0 1  Easily annoyed or irritable 1 2  Afraid - awful might happen 0 2  Total GAD 7 Score 2 9      Review of Systems:   Pertinent items are noted in HPI Denies abnormal vaginal discharge w/ itching/odor/irritation, headaches, visual changes, shortness of breath, chest pain, abdominal pain, severe nausea/vomiting, or problems with urination or bowel movements unless otherwise stated above. Pertinent History Reviewed:  Reviewed past medical,surgical, social, obstetrical and family history.  Reviewed problem list, medications and allergies. Physical Assessment:   Vitals:   02/28/21 0923  BP: 111/73  Pulse: (!) 101  Weight: 134 lb 3.2 oz (60.9 kg)  Body mass index is 24.55 kg/m.        Physical Examination:   General appearance: Well  appearing, and in no distress  Mental status: Alert, oriented to person, place, and time  Skin: Warm & dry  Cardiovascular: Normal heart rate noted  Respiratory: Normal respiratory effort, no distress  Abdomen: Soft, gravid, nontender  Pelvic: Cervical exam deferred         Extremities: Edema: None  Fetal Status: Fetal Heart Rate (bpm): 148 Fundal Height: 28 cm Movement: Present    Chaperone: N/A   No results found for this or any previous visit (from the past 24 hour(s)).  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  Labs/procedures today: labs  Plan:  Continue routine obstetrical care  Next visit: prefers in person    Reviewed: Preterm labor symptoms and general obstetric precautions including but not limited to vaginal bleeding, contractions, leaking of fluid and fetal movement were reviewed in detail with the patient.  All questions were answered. Does have home bp cuff. Office bp cuff given: not applicable. Check bp weekly, let us know if consistently >140 and/or >  90.  Follow-up: Return in about 2 weeks (around 03/14/2021) for [place], [place], in person.  Future Appointments  Date Time Provider [place]  03/14/2021 10:30 AM Roma Schanz, CNM [facility]    Orders Placed This Encounter  Procedures   CBC    Roma Schanz CNM, [facility] 02/28/2021 9:50 AM

## 2021-02-28 NOTE — Patient Instructions (Addendum)
Claudia Marks, thank you for choosing our office today! We appreciate the opportunity to meet your healthcare needs. You may receive a short survey by mail, e-mail, or through [employer]. If you are happy with your care we would appreciate if you could take just a few minutes to complete the survey questions. We read all of your comments and take your feedback very seriously. Thank you again for choosing our office.  Center for [employer] Team at [place] at [facility] ([place], [place] [address]) Entrance C, located off of [address] parking   Tips to Help Leg Cramps Increase dietary sources of calcium (milk, yogurt, cheese, leafy greens, seafood, legumes, and fruit) and magnesium (dark leafy greens, nuts, seeds, fish, beans, whole grains, avocados, yogurt, bananas, dried fruit, dark chocolate) Spoonful of regular yellow mustard every night Pickle juice Magnesium supplement: 2mmol in the morning, 30mmol at night (can find in the vitamin aisle) Thiamine (Vit B1) 100mg  and pyridoxine (Vit B6) 40mg  daily for 2 weeks Dorsiflexion of foot: pointing your toes back towards your knee during the cramp      CLASSES: Go to [email or web address] to register for classes (childbirth, breastfeeding, waterbirth, infant CPR, daddy bootcamp, etc.)  Call the office [phone]) or go to [facility] if: You begin to have strong, frequent contractions Your water breaks.  Sometimes it is a big gush of fluid, sometimes it is just a trickle that keeps getting your panties wet or running down your legs You have vaginal bleeding.  It is normal to have a small amount of spotting if your cervix was checked.  You don't feel your baby moving like normal.  If you don't, get you something to eat and drink and lay down and focus on feeling your baby move.   If your baby is still not moving like normal, you should call the office or go to [facility].  Call  the office [phone]) or go to [facility] hospital for these signs of pre-eclampsia: Severe headache that does not go away with Tylenol Visual changes- seeing spots, double, blurred vision Pain under your right breast or upper abdomen that does not go away with Tums or heartburn medicine Nausea and/or vomiting Severe swelling in your hands, feet, and face   Tdap Vaccine It is recommended that you get the Tdap vaccine during the third trimester of EACH pregnancy to help protect your baby from getting pertussis (whooping cough) 27-36 weeks is the BEST time to do this so that you can pass the protection on to your baby. During pregnancy is better than after pregnancy, but if you are unable to get it during pregnancy it will be offered at the hospital.  You can get this vaccine with [place], at the health department, your family doctor, or some local pharmacies Everyone who will be around your baby should also be up-to-date on their vaccines before the baby comes. Adults (who are not pregnant) only need 1 dose of Tdap during adulthood.   [facility] Pediatricians/Family Doctors [facility]): [address] [place], [facility]: [address] [place], [phone]                [place] [facility]): [place], [phone] (call to ask if accepting patients) [facility] Department: [address], [place], [place] Pediatricians/Family Doctors  [facility]): [address], [place] [facility]: [address] [place], [facility]: [place], [place] [facility]): [phone] [facility]: [address] [place]: [address] [facility]: [phone], [phone]  Home Blood Pressure Monitoring for Patients   Your provider has recommended that you check your blood pressure (BP) at least once a week at home. If you do not have a blood pressure cuff at home, one will be provided for you. Contact your provider if you have not received your monitor within 1 week.   Helpful Tips for Accurate Home Blood Pressure Checks  Don't smoke, exercise, or drink caffeine 30 minutes before checking your BP Use the restroom before checking your BP (a full bladder can raise your pressure) Relax in a comfortable upright chair Feet on the ground Left arm resting comfortably on a flat surface at the level of your heart Legs uncrossed Back supported Sit quietly and don't talk Place the cuff on your bare arm Adjust snuggly, so that only two fingertips can fit between your skin and the top of the cuff Check 2 readings separated by at least one minute Keep a log of your BP readings For a visual, please reference this diagram: http://ccnc.care/bpdiagram  Provider Name: [facility]     Phone: [phone]  Zone 1: ALL CLEAR  Continue to monitor your symptoms:  BP reading is less than 140 (top number) or less than 90 (bottom number)  No right upper stomach pain No headaches or seeing spots No feeling nauseated or throwing up No swelling in face and hands  Zone 2: CAUTION Call your doctor's office for any of the following:  BP reading is greater than 140 (top number) or greater than 90 (bottom number)  Stomach pain under your ribs in the middle or right side Headaches or seeing spots Feeling nauseated or throwing up Swelling in face and hands  Zone 3: EMERGENCY  Seek immediate medical care if you have any of the following:  BP reading is greater than160 (top number) or greater than 110 (bottom number) Severe headaches not improving with Tylenol Serious difficulty catching your breath Any worsening  symptoms from Zone 2   Third Trimester of Pregnancy The third trimester is from week 29 through week 42, months 7 through 9. The third trimester is a time when the fetus is growing rapidly. At the end of the ninth month, the fetus is about 20 inches in length and weighs 6-10 pounds.  BODY CHANGES Your body goes through many changes during pregnancy. The changes vary from woman to woman.  Your weight will continue to increase. You can expect to gain 25-35 pounds (11-16 kg) by the end of the pregnancy. You may begin to get stretch marks on your hips, abdomen, and breasts. You may urinate more often because the fetus is moving lower into your pelvis and pressing on your bladder. You may develop or continue to have heartburn as a result of your pregnancy. You may develop constipation because certain hormones are causing the muscles that push waste through your intestines to slow down. You may develop hemorrhoids or swollen, bulging veins (varicose veins). You may have pelvic pain because of the weight gain and pregnancy hormones relaxing your joints between the  bones in your pelvis. Backaches may result from overexertion of the muscles supporting your posture. You may have changes in your hair. These can include thickening of your hair, rapid growth, and changes in texture. Some women also have hair loss during or after pregnancy, or hair that feels dry or thin. Your hair will most likely return to normal after your baby is born. Your breasts will continue to grow and be tender. A yellow discharge may leak from your breasts called colostrum. Your belly button may stick out. You may feel short of breath because of your expanding uterus. You may notice the fetus "dropping," or moving lower in your abdomen. You may have a bloody mucus discharge. This usually occurs a few days to a week before labor begins. Your cervix becomes thin and soft (effaced) near your due date. WHAT TO EXPECT AT YOUR PRENATAL  EXAMS  You will have prenatal exams every 2 weeks until week 36. Then, you will have weekly prenatal exams. During a routine prenatal visit: You will be weighed to make sure you and the fetus are growing normally. Your blood pressure is taken. Your abdomen will be measured to track your baby's growth. The fetal heartbeat will be listened to. Any test results from the previous visit will be discussed. You may have a cervical check near your due date to see if you have effaced. At around 36 weeks, your caregiver will check your cervix. At the same time, your caregiver will also perform a test on the secretions of the vaginal tissue. This test is to determine if a type of bacteria, Group B streptococcus, is present. Your caregiver will explain this further. Your caregiver may ask you: What your birth plan is. How you are feeling. If you are feeling the baby move. If you have had any abnormal symptoms, such as leaking fluid, bleeding, severe headaches, or abdominal cramping. If you have any questions. Other tests or screenings that may be performed during your third trimester include: Blood tests that check for low iron levels (anemia). Fetal testing to check the health, activity level, and growth of the fetus. Testing is done if you have certain medical conditions or if there are problems during the pregnancy. FALSE LABOR You may feel small, irregular contractions that eventually go away. These are called Braxton Hicks contractions, or false labor. Contractions may last for hours, days, or even weeks before true labor sets in. If contractions come at regular intervals, intensify, or become painful, it is best to be seen by your caregiver.  SIGNS OF LABOR  Menstrual-like cramps. Contractions that are 5 minutes apart or less. Contractions that start on the top of the uterus and spread down to the lower abdomen and back. A sense of increased pelvic pressure or back pain. A watery or bloody mucus  discharge that comes from the vagina. If you have any of these signs before the 37th week of pregnancy, call your caregiver right away. You need to go to the hospital to get checked immediately. HOME CARE INSTRUCTIONS  Avoid all smoking, herbs, alcohol, and unprescribed drugs. These chemicals affect the formation and growth of the baby. Follow your caregiver's instructions regarding medicine use. There are medicines that are either safe or unsafe to take during pregnancy. Exercise only as directed by your caregiver. Experiencing uterine cramps is a good sign to stop exercising. Continue to eat regular, healthy meals. Wear a good support bra for breast tenderness. Do not use hot tubs, steam rooms, or saunas.  Wear your seat belt at all times when driving. Avoid raw meat, uncooked cheese, cat litter boxes, and soil used by cats. These carry germs that can cause birth defects in the baby. Take your prenatal vitamins. Try taking a stool softener (if your caregiver approves) if you develop constipation. Eat more high-fiber foods, such as fresh vegetables or fruit and whole grains. Drink plenty of fluids to keep your urine clear or pale yellow. Take warm sitz baths to soothe any pain or discomfort caused by hemorrhoids. Use hemorrhoid cream if your caregiver approves. If you develop varicose veins, wear support hose. Elevate your feet for 15 minutes, 3-4 times a day. Limit salt in your diet. Avoid heavy lifting, wear low heal shoes, and practice good posture. Rest a lot with your legs elevated if you have leg cramps or low back pain. Visit your dentist if you have not gone during your pregnancy. Use a soft toothbrush to brush your teeth and be gentle when you floss. A sexual relationship may be continued unless your caregiver directs you otherwise. Do not travel far distances unless it is absolutely necessary and only with the approval of your caregiver. Take prenatal classes to understand, practice,  and ask questions about the labor and delivery. Make a trial run to the hospital. Pack your hospital bag. Prepare the baby's nursery. Continue to go to all your prenatal visits as directed by your caregiver. SEEK MEDICAL CARE IF: You are unsure if you are in labor or if your water has broken. You have dizziness. You have mild pelvic cramps, pelvic pressure, or nagging pain in your abdominal area. You have persistent nausea, vomiting, or diarrhea. You have a bad smelling vaginal discharge. You have pain with urination. SEEK IMMEDIATE MEDICAL CARE IF:  You have a fever. You are leaking fluid from your vagina. You have spotting or bleeding from your vagina. You have severe abdominal cramping or pain. You have rapid weight loss or gain. You have shortness of breath with chest pain. You notice sudden or extreme swelling of your face, hands, ankles, feet, or legs. You have not felt your baby move in over an hour. You have severe headaches that do not go away with medicine. You have vision changes. Document Released: 09/01/2001 Document Revised: 09/12/2013 Document Reviewed: 11/08/2012 [facility] Patient Information 2015 [place], [place]. This information is not intended to replace advice given to you by your health care provider. Make sure you discuss any questions you have with your health care provider.

## 2021-03-01 LAB — CBC
Hematocrit: 32.7 % — ABNORMAL LOW (ref 34.0–46.6)
Hemoglobin: 11.2 g/dL (ref 11.1–15.9)
MCH: 31.5 pg (ref 26.6–33.0)
MCHC: 34.3 g/dL (ref 31.5–35.7)
MCV: 92 fL (ref 79–97)
Platelets: 119 10*3/uL — ABNORMAL LOW (ref 150–450)
RBC: 3.55 x10E6/uL — ABNORMAL LOW (ref 3.77–5.28)
RDW: 11.6 % — ABNORMAL LOW (ref 11.7–15.4)
WBC: 7.4 10*3/uL (ref 3.4–10.8)

## 2021-03-14 ENCOUNTER — Ambulatory Visit (INDEPENDENT_AMBULATORY_CARE_PROVIDER_SITE_OTHER): Payer: BC Managed Care – PPO | Admitting: Women's Health

## 2021-03-14 ENCOUNTER — Other Ambulatory Visit: Payer: Self-pay

## 2021-03-14 ENCOUNTER — Encounter: Payer: Self-pay | Admitting: Women's Health

## 2021-03-14 VITALS — BP 100/67 | HR 81 | Wt 139.0 lb

## 2021-03-14 DIAGNOSIS — Z3480 Encounter for supervision of other normal pregnancy, unspecified trimester: Secondary | ICD-10-CM

## 2021-03-14 DIAGNOSIS — Z3483 Encounter for supervision of other normal pregnancy, third trimester: Secondary | ICD-10-CM

## 2021-03-14 NOTE — Progress Notes (Signed)
LOW-RISK PREGNANCY VISIT Patient name: Claudia Marks MRN [id number]  Date of birth: Nov 17, 1995 Chief Complaint:   Routine Prenatal Visit  History of Present Illness:   Claudia Marks is a 25 y.o. G67P1001 female at [redacted]w[redacted]d with an Estimated Date of Delivery: 05/10/21 being seen today for ongoing management of a low-risk pregnancy.   Today she reports no complaints. Contractions: Not present. Vag. Bleeding: None.  Movement: Present. denies leaking of fluid.  Depression screen [facility] 2/9 01/31/2021 10/31/2020 06/06/2018  Decreased Interest 0 2 0  Down, Depressed, Hopeless 0 1 0  PHQ - 2 Score 0 3 0  Altered sleeping 1 1 -  Tired, decreased energy 1 3 -  Change in appetite 0 3 -  Feeling bad or failure about yourself  0 0 -  Trouble concentrating 0 2 -  Moving slowly or fidgety/restless 0 0 -  Suicidal thoughts 0 0 -  PHQ-9 Score 2 12 -     GAD 7 : Generalized Anxiety Score 01/31/2021 10/31/2020  Nervous, Anxious, on Edge 1 1  Control/stop worrying 0 1  Worry too much - different things 0 1  Trouble relaxing 0 1  Restless 0 1  Easily annoyed or irritable 1 2  Afraid - awful might happen 0 2  Total GAD 7 Score 2 9      Review of Systems:   Pertinent items are noted in HPI Denies abnormal vaginal discharge w/ itching/odor/irritation, headaches, visual changes, shortness of breath, chest pain, abdominal pain, severe nausea/vomiting, or problems with urination or bowel movements unless otherwise stated above. Pertinent History Reviewed:  Reviewed past medical,surgical, social, obstetrical and family history.  Reviewed problem list, medications and allergies. Physical Assessment:   Vitals:   03/14/21 1033  BP: 100/67  Pulse: 81  Weight: 139 lb (63 kg)  Body mass index is 25.42 kg/m.        Physical Examination:   General appearance: Well appearing, and in no distress  Mental status: Alert, oriented to person, place, and time  Skin: Warm & dry  Cardiovascular: Normal heart  rate noted  Respiratory: Normal respiratory effort, no distress  Abdomen: Soft, gravid, nontender  Pelvic: Cervical exam deferred         Extremities: Edema: None  Fetal Status: Fetal Heart Rate (bpm): 137 Fundal Height: 30 cm Movement: Present    Chaperone: N/A   No results found for this or any previous visit (from the past 24 hour(s)).  Assessment & Plan:  1) Low-risk pregnancy G2P1001 at [redacted]w[redacted]d with an Estimated Date of Delivery: 05/10/21   2) Gestational thromboyctopenia, plt 119 at last check (29wks), plan repeat @ 36wk___   Meds: No orders of the defined types were placed in this encounter.  Labs/procedures today: none  Plan:  Continue routine obstetrical care  Next visit: prefers in person    Reviewed: Preterm labor symptoms and general obstetric precautions including but not limited to vaginal bleeding, contractions, leaking of fluid and fetal movement were reviewed in detail with the patient.  All questions were answered. Does have home bp cuff. Office bp cuff given: not applicable. Check bp weekly, let us know if consistently >140 and/or >90.  Follow-up: Return in about 2 weeks (around 03/28/2021) for [place], [place], in person.  Future Appointments  Date Time Provider [place]  03/31/2021 10:10 AM Florian Buff, MD [facility]    No orders of the defined types were placed in this encounter.  [place],  WHNP-BC 03/14/2021 11:02 AM

## 2021-03-14 NOTE — Patient Instructions (Signed)
Claudia Marks, thank you for choosing our office today! We appreciate the opportunity to meet your healthcare needs. You may receive a short survey by mail, e-mail, or through [employer]. If you are happy with your care we would appreciate if you could take just a few minutes to complete the survey questions. We read all of your comments and take your feedback very seriously. Thank you again for choosing our office.  [facility] [employer] Team at [place] [facility] ([place], [place] [id number]) Entrance C, located off of [address] parking   CLASSES: Go to [email or web address] to register for classes (childbirth, breastfeeding, waterbirth, infant CPR, daddy bootcamp, etc.)  Call the office [phone]) or go to [facility] if: You begin to have strong, frequent contractions Your water breaks.  Sometimes it is a big gush of fluid, sometimes it is just a trickle that keeps getting your panties wet or running down your legs You have vaginal bleeding.  It is normal to have a small amount of spotting if your cervix was checked.  You don't feel your baby moving like normal.  If you don't, get you something to eat and drink and lay down and focus on feeling your baby move.   If your baby is still not moving like normal, you should call the office or go to [facility].  Call the office [phone]) or go to [facility] hospital for these signs of pre-eclampsia: Severe headache that does not go away with Tylenol Visual changes- seeing spots, double, blurred vision Pain under your right breast or upper abdomen that does not go away with Tums or heartburn medicine Nausea and/or vomiting Severe swelling in your hands, feet, and face   Tdap Vaccine It is recommended that you get the Tdap vaccine during the third trimester of EACH pregnancy to help protect your baby from getting pertussis (whooping cough) 27-36 weeks is the BEST time to do  this so that you can pass the protection on to your baby. During pregnancy is better than after pregnancy, but if you are unable to get it during pregnancy it will be offered at the hospital.  You can get this vaccine with [place], at the health department, your family doctor, or some local pharmacies Everyone who will be around your baby should also be up-to-date on their vaccines before the baby comes. Adults (who are not pregnant) only need 1 dose of Tdap during adulthood.   [facility] Pediatricians/Family Doctors [facility]): [address] [place], [facility]: [address] [place], [phone]                [facility]): [place], [phone] (call to ask if accepting patients) [facility] Department: [place] [address], [place], [place] Pediatricians/Family Doctors [facility]): [place], [address], [place] [facility]: [address] [place], [place] [facility] [place]: [place], [place] [facility]): [phone] [facility]: [address] [place]: [address] [facility]: [phone], [phone]  Home Blood Pressure Monitoring for Patients   Your provider has recommended that you check your  blood pressure (BP) at least once a week at home. If you do not have a blood pressure cuff at home, one will be provided for you. Contact your provider if you have not received your monitor within 1 week.   Helpful Tips for Accurate Home Blood Pressure Checks  Don't smoke, exercise, or drink caffeine 30 minutes before checking your BP Use the restroom before checking your BP (a full bladder can raise your  pressure) Relax in a comfortable upright chair Feet on the ground Left arm resting comfortably on a flat surface at the level of your heart Legs uncrossed Back supported Sit quietly and don't talk Place the cuff on your bare arm Adjust snuggly, so that only two fingertips can fit between your skin and the top of the cuff Check 2 readings separated by at least one minute Keep a log of your BP readings For a visual, please reference this diagram: http://ccnc.care/bpdiagram  Provider Name: [facility]     Phone: [phone]  Zone 1: ALL CLEAR  Continue to monitor your symptoms:  BP reading is less than 140 (top number) or less than 90 (bottom number)  No right upper stomach pain No headaches or seeing spots No feeling nauseated or throwing up No swelling in face and hands  Zone 2: CAUTION Call your doctor's office for any of the following:  BP reading is greater than 140 (top number) or greater than 90 (bottom number)  Stomach pain under your ribs in the middle or right side Headaches or seeing spots Feeling nauseated or throwing up Swelling in face and hands  Zone 3: EMERGENCY  Seek immediate medical care if you have any of the following:  BP reading is greater than160 (top number) or greater than 110 (bottom number) Severe headaches not improving with Tylenol Serious difficulty catching your breath Any worsening symptoms from Zone 2  Preterm Labor and Birth Information  The normal length of a pregnancy is 39-41 weeks. Preterm labor is when labor starts before 37 completed weeks of pregnancy. What are the risk factors for preterm labor? Preterm labor is more likely to occur in women who: Have certain infections during pregnancy such as a bladder infection, sexually transmitted infection, or infection inside the uterus (chorioamnionitis). Have a shorter-than-normal cervix. Have gone into preterm labor before. Have had surgery on their cervix. Are younger than age 17  or older than age 35. Are African American. Are pregnant with twins or multiple babies (multiple gestation). Take street drugs or smoke while pregnant. Do not gain enough weight while pregnant. Became pregnant shortly after having been pregnant. What are the symptoms of preterm labor? Symptoms of preterm labor include: Cramps similar to those that can happen during a menstrual period. The cramps may happen with diarrhea. Pain in the abdomen or lower back. Regular uterine contractions that may feel like tightening of the abdomen. A feeling of increased pressure in the pelvis. Increased watery or bloody mucus discharge from the vagina. Water breaking (ruptured amniotic sac). Why is it important to recognize signs of preterm labor? It is important to recognize signs of preterm labor because babies who are born prematurely may not be fully developed. This can put them at an increased risk for: Long-term (chronic) heart and lung problems. Difficulty immediately after birth with regulating body systems, including blood sugar, body temperature, heart rate, and breathing rate. Bleeding in the brain. Cerebral palsy. Learning difficulties. Death. These risks are highest for babies who are born before 34 weeks   of pregnancy. How is preterm labor treated? Treatment depends on the length of your pregnancy, your condition, and the health of your baby. It may involve: Having a stitch (suture) placed in your cervix to prevent your cervix from opening too early (cerclage). Taking or being given medicines, such as: Hormone medicines. These may be given early in pregnancy to help support the pregnancy. Medicine to stop contractions. Medicines to help mature the baby's lungs. These may be prescribed if the risk of delivery is high. Medicines to prevent your baby from developing cerebral palsy. If the labor happens before 34 weeks of pregnancy, you may need to stay in the hospital. What should I do if I  think I am in preterm labor? If you think that you are going into preterm labor, call your health care provider right away. How can I prevent preterm labor in future pregnancies? To increase your chance of having a full-term pregnancy: Do not use any tobacco products, such as cigarettes, chewing tobacco, and e-cigarettes. If you need help quitting, ask your health care provider. Do not use street drugs or medicines that have not been prescribed to you during your pregnancy. Talk with your health care provider before taking any herbal supplements, even if you have been taking them regularly. Make sure you gain a healthy amount of weight during your pregnancy. Watch for infection. If you think that you might have an infection, get it checked right away. Make sure to tell your health care provider if you have gone into preterm labor before. This information is not intended to replace advice given to you by your health care provider. Make sure you discuss any questions you have with your health care provider. Document Revised: 12/30/2018 Document Reviewed: 01/29/2016 Elsevier Patient Education  2020 Elsevier Inc.   

## 2021-03-31 ENCOUNTER — Encounter: Payer: BC Managed Care – PPO | Admitting: Obstetrics & Gynecology

## 2021-04-08 ENCOUNTER — Other Ambulatory Visit: Payer: Self-pay

## 2021-04-08 ENCOUNTER — Encounter: Payer: Self-pay | Admitting: Obstetrics & Gynecology

## 2021-04-08 ENCOUNTER — Ambulatory Visit (INDEPENDENT_AMBULATORY_CARE_PROVIDER_SITE_OTHER): Payer: BC Managed Care – PPO | Admitting: Obstetrics & Gynecology

## 2021-04-08 VITALS — BP 101/71 | HR 77 | Wt 144.0 lb

## 2021-04-08 DIAGNOSIS — O99113 Other diseases of the blood and blood-forming organs and certain disorders involving the immune mechanism complicating pregnancy, third trimester: Secondary | ICD-10-CM

## 2021-04-08 DIAGNOSIS — Z3480 Encounter for supervision of other normal pregnancy, unspecified trimester: Secondary | ICD-10-CM

## 2021-04-08 DIAGNOSIS — Z3A35 35 weeks gestation of pregnancy: Secondary | ICD-10-CM

## 2021-04-08 DIAGNOSIS — D696 Thrombocytopenia, unspecified: Secondary | ICD-10-CM

## 2021-04-08 NOTE — Progress Notes (Signed)
   LOW-RISK PREGNANCY VISIT Patient name: Claudia Marks MRN [id number]  Date of birth: [birth year] Chief Complaint:   Routine Prenatal Visit  History of Present Illness:   Claudia Marks is a 25 y.o. G56P1001 female at [redacted]w[redacted]d with an Estimated Date of Delivery: 05/10/21 being seen today for ongoing management of a low-risk pregnancy.  Depression screen [facility] 2/9 01/31/2021 10/31/2020 06/06/2018  Decreased Interest 0 2 0  Down, Depressed, Hopeless 0 1 0  PHQ - 2 Score 0 3 0  Altered sleeping 1 1 -  Tired, decreased energy 1 3 -  Change in appetite 0 3 -  Feeling bad or failure about yourself  0 0 -  Trouble concentrating 0 2 -  Moving slowly or fidgety/restless 0 0 -  Suicidal thoughts 0 0 -  PHQ-9 Score 2 12 -    Today she reports no complaints. Contractions: Not present. Vag. Bleeding: None.  Movement: Present. denies leaking of fluid. Review of Systems:   Pertinent items are noted in HPI Denies abnormal vaginal discharge w/ itching/odor/irritation, headaches, visual changes, shortness of breath, chest pain, abdominal pain, severe nausea/vomiting, or problems with urination or bowel movements unless otherwise stated above. Pertinent History Reviewed:  Reviewed past medical,surgical, social, obstetrical and family history.  Reviewed problem list, medications and allergies. Physical Assessment:   Vitals:   04/08/21 0837  BP: 101/71  Pulse: 77  Weight: 144 lb (65.3 kg)  Body mass index is 26.34 kg/m.        Physical Examination:   General appearance: Well appearing, and in no distress  Mental status: Alert, oriented to person, place, and time  Skin: Warm & dry  Cardiovascular: Normal heart rate noted  Respiratory: Normal respiratory effort, no distress  Abdomen: Soft, gravid, nontender  Pelvic: Cervical exam deferred         Extremities: Edema: None  Fetal Status: Fetal Heart Rate (bpm): 135 Fundal Height: 34 cm Movement: Present    Chaperone: n/a    No results found for this  or any previous visit (from the past 24 hour(s)).  Assessment & Plan:  1) Low-risk pregnancy G2P1001 at [redacted]w[redacted]d with an Estimated Date of Delivery: 05/10/21   2) Gestational thrombocytopenia, repeat platelet count at next visit, along with routine cultures,    Meds: No orders of the defined types were placed in this encounter.  Labs/procedures today:   Plan:  Continue routine obstetrical care  Next visit: prefers in person    Reviewed: Preterm labor symptoms and general obstetric precautions including but not limited to vaginal bleeding, contractions, leaking of fluid and fetal movement were reviewed in detail with the patient.  All questions were answered. Has home bp cuff. Rx faxed to . Check bp weekly, let us know if >140/90.   Follow-up: Return in about 10 days (around 04/18/2021) for [place].  No orders of the defined types were placed in this encounter.   Florian Buff, MD 04/08/2021 8:57 AM

## 2021-04-17 ENCOUNTER — Encounter: Payer: Self-pay | Admitting: Obstetrics & Gynecology

## 2021-04-17 ENCOUNTER — Other Ambulatory Visit (HOSPITAL_COMMUNITY)
Admission: RE | Admit: 2021-04-17 | Discharge: 2021-04-17 | Disposition: A | Discharge status: Home / Self Care | Payer: BC Managed Care – PPO | Source: Ambulatory Visit | Attending: Obstetrics & Gynecology | Admitting: Obstetrics & Gynecology

## 2021-04-17 ENCOUNTER — Other Ambulatory Visit: Payer: Self-pay

## 2021-04-17 ENCOUNTER — Ambulatory Visit (INDEPENDENT_AMBULATORY_CARE_PROVIDER_SITE_OTHER): Payer: BC Managed Care – PPO | Admitting: Obstetrics & Gynecology

## 2021-04-17 VITALS — BP 104/71 | HR 76 | Wt 142.4 lb

## 2021-04-17 DIAGNOSIS — Z3A36 36 weeks gestation of pregnancy: Secondary | ICD-10-CM | POA: Diagnosis not present

## 2021-04-17 DIAGNOSIS — Z3480 Encounter for supervision of other normal pregnancy, unspecified trimester: Secondary | ICD-10-CM | POA: Insufficient documentation

## 2021-04-17 DIAGNOSIS — D696 Thrombocytopenia, unspecified: Secondary | ICD-10-CM

## 2021-04-17 DIAGNOSIS — O99119 Other diseases of the blood and blood-forming organs and certain disorders involving the immune mechanism complicating pregnancy, unspecified trimester: Secondary | ICD-10-CM

## 2021-04-17 DIAGNOSIS — O99113 Other diseases of the blood and blood-forming organs and certain disorders involving the immune mechanism complicating pregnancy, third trimester: Secondary | ICD-10-CM

## 2021-04-17 NOTE — Progress Notes (Signed)
   LOW-RISK PREGNANCY VISIT Patient name: Claudia Marks MRN [id number]  Date of birth: July 28, 1996 Chief Complaint:   Routine Prenatal Visit  History of Present Illness:   AREMI DEATON is a 25 y.o. G44P1001 female at 55w5dwith an Estimated Date of Delivery: 05/10/21 being seen today for ongoing management of a low-risk pregnancy-  Pregnancy complicated by: -Gestational thrombocytopenia '[]'$  repeat CBC today Briefly discussed concerns regarding potential for epidural  [facility] 01/31/2021 10/31/2020 06/06/2018  Decreased Interest 0 2 0  Down, Depressed, Hopeless 0 1 0  PHQ - 2 Score 0 3 0  Altered sleeping 1 1 -  Tired, decreased energy 1 3 -  Change in appetite 0 3 -  Feeling bad or failure about yourself  0 0 -  Trouble concentrating 0 2 -  Moving slowly or fidgety/restless 0 0 -  Suicidal thoughts 0 0 -  PHQ-9 Score 2 12 -    Today she reports no complaints. Contractions: Not present. Vag. Bleeding: None.  Movement: Present. denies leaking of fluid. Review of Systems:   Pertinent items are noted in HPI Denies abnormal vaginal discharge w/ itching/odor/irritation, headaches, visual changes, shortness of breath, chest pain, abdominal pain, severe nausea/vomiting, or problems with urination or bowel movements unless otherwise stated above. Pertinent History Reviewed:  Reviewed past medical,surgical, social, obstetrical and family history.  Reviewed problem list, medications and allergies.  Physical Assessment:   Vitals:   04/17/21 0959  BP: 104/71  Pulse: 76  Weight: 142 lb 6.4 oz (64.6 kg)  Body mass index is 26.05 kg/m.        Physical Examination:   General appearance: Well appearing, and in no distress  Mental status: Alert, oriented to person, place, and time  Skin: Warm & dry  Respiratory: Normal respiratory effort, no distress  Abdomen: Soft, gravid, nontender  Pelvic: Cervical exam deferred  Vaginal swabs obtained       Extremities: Edema:  None  Psych:  mood and affect appropriate  Fetal Status: Fetal Heart Rate (bpm): 140 Fundal Height: 35 cm Movement: Present    Chaperone: Latisha Cresenzo    No results found for this or any previous visit (from the past 24 hour(s)).   Assessment & Plan:  1) Low-risk pregnancy G2P1001 at 357w5dith an Estimated Date of Delivery: 05/10/21   2) Gest Thrombocytopenia '[]'$  CBC today   Meds: No orders of the defined types were placed in this encounter.  Labs/procedures today: CBC, GBS, GC/C  Plan:  Continue routine obstetrical care Next visit: prefers in person    Reviewed: Term labor symptoms and general obstetric precautions including but not limited to vaginal bleeding, contractions, leaking of fluid and fetal movement were reviewed in detail with the patient.  All questions were answered. Pt has home bp cuff. Check bp weekly, let usKoreanow if >140/90.   Follow-up: Return in about 1 week (around 04/24/2021) for LRDentonis[place].  Orders Placed This Encounter  Procedures   Culture, beta strep (group b only)   CBC    JeJanyth PupaDO Attending ObVamoFaMidwayor WoDe[employer] CompanyCoGeorge [place]

## 2021-04-18 LAB — CBC
Hematocrit: 31.3 % — ABNORMAL LOW (ref 34.0–46.6)
Hemoglobin: 10.5 g/dL — ABNORMAL LOW (ref 11.1–15.9)
MCH: 29.7 pg (ref 26.6–33.0)
MCHC: 33.5 g/dL (ref 31.5–35.7)
MCV: 89 fL (ref 79–97)
Platelets: 117 10*3/uL — ABNORMAL LOW (ref 150–450)
RBC: 3.53 x10E6/uL — ABNORMAL LOW (ref 3.77–5.28)
RDW: 12 % (ref 11.7–15.4)
WBC: 7.1 10*3/uL (ref 3.4–10.8)

## 2021-04-18 LAB — CERVICOVAGINAL ANCILLARY ONLY
Chlamydia: NEGATIVE
Comment: NEGATIVE
Comment: NORMAL
Neisseria Gonorrhea: NEGATIVE

## 2021-04-21 LAB — CULTURE, BETA STREP (GROUP B ONLY): Strep Gp B Culture: NEGATIVE

## 2021-04-24 ENCOUNTER — Encounter: Payer: Self-pay | Admitting: Women's Health

## 2021-04-24 ENCOUNTER — Ambulatory Visit (INDEPENDENT_AMBULATORY_CARE_PROVIDER_SITE_OTHER): Payer: BC Managed Care – PPO | Admitting: Women's Health

## 2021-04-24 ENCOUNTER — Other Ambulatory Visit: Payer: Self-pay

## 2021-04-24 VITALS — BP 105/69 | HR 79 | Wt 143.0 lb

## 2021-04-24 DIAGNOSIS — Z3A37 37 weeks gestation of pregnancy: Secondary | ICD-10-CM

## 2021-04-24 DIAGNOSIS — Z3483 Encounter for supervision of other normal pregnancy, third trimester: Secondary | ICD-10-CM

## 2021-04-24 NOTE — Progress Notes (Signed)
    LOW-RISK PREGNANCY VISIT Patient name: Claudia Marks MRN [id number]  Date of birth: 1996-07-20 Chief Complaint:   Routine Prenatal Visit  History of Present Illness:   Claudia Marks is a 25 y.o. G15P1001 female at 12w5dwith an Estimated Date of Delivery: 05/10/21 being seen today for ongoing management of a low-risk pregnancy.   Today she reports no complaints. Contractions: Not present. Vag. Bleeding: None.  Movement: Present. denies leaking of fluid.  [facility] 01/31/2021 10/31/2020 06/06/2018  Decreased Interest 0 2 0  Down, Depressed, Hopeless 0 1 0  PHQ - 2 Score 0 3 0  Altered sleeping 1 1 -  Tired, decreased energy 1 3 -  Change in appetite 0 3 -  Feeling bad or failure about yourself  0 0 -  Trouble concentrating 0 2 -  Moving slowly or fidgety/restless 0 0 -  Suicidal thoughts 0 0 -  PHQ-9 Score 2 12 -     GAD 7 : Generalized Anxiety Score 01/31/2021 10/31/2020  Nervous, Anxious, on Edge 1 1  Control/stop worrying 0 1  Worry too much - different things 0 1  Trouble relaxing 0 1  Restless 0 1  Easily annoyed or irritable 1 2  Afraid - awful might happen 0 2  Total GAD 7 Score 2 9      Review of Systems:   Pertinent items are noted in HPI Denies abnormal vaginal discharge w/ itching/odor/irritation, headaches, visual changes, shortness of breath, chest pain, abdominal pain, severe nausea/vomiting, or problems with urination or bowel movements unless otherwise stated above. Pertinent History Reviewed:  Reviewed past medical,surgical, social, obstetrical and family history.  Reviewed problem list, medications and allergies. Physical Assessment:   Vitals:   04/24/21 1004  BP: 105/69  Pulse: 79  Weight: 143 lb (64.9 kg)  Body mass index is 26.16 kg/m.        Physical Examination:   General appearance: Well appearing, and in no distress  Mental status: Alert, oriented to person, place, and time  Skin: Warm & dry  Cardiovascular: Normal heart  rate noted  Respiratory: Normal respiratory effort, no distress  Abdomen: Soft, gravid, nontender  Pelvic: Cervical exam deferred         Extremities: Edema: None  Fetal Status: Fetal Heart Rate (bpm): 140 Fundal Height: 35 cm Movement: Present Presentation: Vertex by Leopold's  Chaperone: N/A   No results found for this or any previous visit (from the past 24 hour(s)).  Assessment & Plan:  1) Low-risk pregnancy G2P1001 at 354w5dith an Estimated Date of Delivery: 05/10/21   2) Gestational thrombocytopenia, plts stable at 117 last week   Meds: No orders of the defined types were placed in this encounter.  Labs/procedures today: none  Plan:  Continue routine obstetrical care  Next visit: prefers in person    Reviewed: Term labor symptoms and general obstetric precautions including but not limited to vaginal bleeding, contractions, leaking of fluid and fetal movement were reviewed in detail with the patient.  All questions were answered. Does have home bp cuff. Office bp cuff given: not applicable. Check bp weekly, let usKoreanow if consistently >140 and/or >90.  Follow-up: Return in about 1 week (around 05/01/2021) for [place].  No future appointments.  No orders of the defined types were placed in this encounter.  [facility] AirWHDoctors Outpatient [facility]/12/2020 10:19 AM

## 2021-04-24 NOTE — Patient Instructions (Signed)
Claudia Marks, thank you for choosing our office today! We appreciate the opportunity to meet your healthcare needs. You may receive a short survey by mail, e-mail, or through [employer]. If you are happy with your care we would appreciate if you could take just a few minutes to complete the survey questions. We read all of your comments and take your feedback very seriously. Thank you again for choosing our office.  Center for [employer] Team at [place] [facility] ([place], [place] [address]) Entrance C, located off of [address] parking   CLASSES: Go to [email or web address] to register for classes (childbirth, breastfeeding, waterbirth, infant CPR, daddy bootcamp, etc.)  Call the office [phone]) or go to [facility] if: You begin to have strong, frequent contractions Your water breaks.  Sometimes it is a big gush of fluid, sometimes it is just a trickle that keeps getting your panties wet or running down your legs You have vaginal bleeding.  It is normal to have a small amount of spotting if your cervix was checked.  You don't feel your baby moving like normal.  If you don't, get you something to eat and drink and lay down and focus on feeling your baby move.   If your baby is still not moving like normal, you should call the office or go to [facility].  Call the office [phone]) or go to [facility] hospital for these signs of pre-eclampsia: Severe headache that does not go away with Tylenol Visual changes- seeing spots, double, blurred vision Pain under your right breast or upper abdomen that does not go away with Tums or heartburn medicine Nausea and/or vomiting Severe swelling in your hands, feet, and face   [facility] Pediatricians/Family Doctors [facility]): [address] [place], [place]: [address] [place], [place] [facility]): [place], [phone] (call to ask if accepting patients) [facility] Department: [address], [place], [place] [facility]): [address], [place] [facility]: [address] [place], [place] [facility] of [place]: [place], [place] [facility]): [phone] [facility]: [address] [place]: [address] [place] [facility]: [phone], [phone]  Home Blood Pressure Monitoring for Patients   Your provider has recommended that you check your blood pressure (BP) at least once a week at home. If you do not have a blood pressure cuff at home, one will be provided for you. Contact your provider if you have not received your monitor within 1 week.   Helpful Tips for Accurate Home Blood Pressure Checks  Don't smoke, exercise, or drink caffeine 30 minutes before checking your BP Use the restroom before checking your BP (a full bladder can raise your pressure) Relax in a comfortable upright chair Feet on the ground Left arm resting comfortably on a flat surface at the level of your heart Legs uncrossed Back supported Sit quietly and don't talk Place the cuff on your bare arm Adjust snuggly, so that only two fingertips  can fit between your skin and the top of the cuff Check 2 readings separated by at least one minute Keep a log of your BP readings For a visual, please reference this diagram: http://ccnc.care/bpdiagram  Provider Name: [facility]     Phone: [phone]  Zone 1: ALL CLEAR  Continue to monitor your symptoms:  BP reading is less than 140 (top number) or less than 90 (bottom number)  No right  upper stomach pain No headaches or seeing spots No feeling nauseated or throwing up No swelling in face and hands  Zone 2: CAUTION Call your doctor's office for any of the following:  BP reading is greater than 140 (top number) or greater than 90 (bottom number)  Stomach pain under your ribs in the middle or right side Headaches or seeing spots Feeling nauseated or throwing up Swelling in face and hands  Zone 3: EMERGENCY  Seek immediate medical care if you have any of the following:  BP reading is greater than160 (top number) or greater than 110 (bottom number) Severe headaches not improving with Tylenol Serious difficulty catching your breath Any worsening symptoms from Zone 2   Braxton Hicks Contractions Contractions of the uterus can occur throughout pregnancy, but they are not always a sign that you are in labor. You may have practice contractions called Braxton Hicks contractions. These false labor contractions are sometimes confused with true labor. What are [place] contractions? Braxton Hicks contractions are tightening movements that occur in the muscles of the uterus before labor. Unlike true labor contractions, these contractions do not result in opening (dilation) and thinning of the cervix. Toward the end of pregnancy (32-34 weeks), Braxton Hicks contractions can happen more often and may become stronger. These contractions are sometimes difficult to tell apart from true labor because they can be very uncomfortable. You should not feel embarrassed if you go to the hospital with false labor. Sometimes, the only way to tell if you are in true labor is for your health care provider to look for changes in the cervix. The health care provider will do a physical exam and may monitor your contractions. If you are not in true labor, the exam should show that your cervix is not dilating and your water has not broken. If there are no other health problems associated with your  pregnancy, it is completely safe for you to be sent home with false labor. You may continue to have Braxton Hicks contractions until you go into true labor. How to tell the difference between true labor and false labor True labor Contractions last 30-70 seconds. Contractions become very regular. Discomfort is usually felt in the top of the uterus, and it spreads to the lower abdomen and low back. Contractions do not go away with walking. Contractions usually become more intense and increase in frequency. The cervix dilates and gets thinner. False labor Contractions are usually shorter and not as strong as true labor contractions. Contractions are usually irregular. Contractions are often felt in the front of the lower abdomen and in the groin. Contractions may go away when you walk around or change positions while lying down. Contractions get weaker and are shorter-lasting as time goes on. The cervix usually does not dilate or become thin. Follow these instructions at home:  Take over-the-counter and prescription medicines only as told by your health care provider. Keep up with your usual exercises and follow other instructions from your health care provider. Eat and drink lightly if you think  you are going into labor. If Braxton Hicks contractions are making you uncomfortable: Change your position from lying down or resting to walking, or change from walking to resting. Sit and rest in a tub of warm water. Drink enough fluid to keep your urine pale yellow. Dehydration may cause these contractions. Do slow and deep breathing several times an hour. Keep all follow-up prenatal visits as told by your health care provider. This is important. Contact a health care provider if: You have a fever. You have continuous pain in your abdomen. Get help right away if: Your contractions become stronger, more regular, and closer together. You have fluid leaking or gushing from your vagina. You pass  blood-tinged mucus (bloody show). You have bleeding from your vagina. You have low back pain that you never had before. You feel your baby's head pushing down and causing pelvic pressure. Your baby is not moving inside you as much as it used to. Summary Contractions that occur before labor are called Braxton Hicks contractions, false labor, or practice contractions. Braxton Hicks contractions are usually shorter, weaker, farther apart, and less regular than true labor contractions. True labor contractions usually become progressively stronger and regular, and they become more frequent. Manage discomfort from [facility] contractions by changing position, resting in a warm bath, drinking plenty of water, or practicing deep breathing. This information is not intended to replace advice given to you by your health care provider. Make sure you discuss any questions you have with your health care provider. Document Revised: 08/20/2017 Document Reviewed: 01/21/2017 Elsevier Patient Education  [place].

## 2021-05-02 ENCOUNTER — Other Ambulatory Visit: Payer: Self-pay

## 2021-05-02 ENCOUNTER — Ambulatory Visit (INDEPENDENT_AMBULATORY_CARE_PROVIDER_SITE_OTHER): Payer: BC Managed Care – PPO | Admitting: Obstetrics & Gynecology

## 2021-05-02 ENCOUNTER — Encounter: Payer: Self-pay | Admitting: Obstetrics & Gynecology

## 2021-05-02 VITALS — BP 106/69 | HR 78 | Wt 144.0 lb

## 2021-05-02 DIAGNOSIS — Z3A38 38 weeks gestation of pregnancy: Secondary | ICD-10-CM

## 2021-05-02 DIAGNOSIS — Z3483 Encounter for supervision of other normal pregnancy, third trimester: Secondary | ICD-10-CM

## 2021-05-02 MED ORDER — OMEPRAZOLE 20 MG PO CPDR: 20.0000 mg | DELAYED_RELEASE_CAPSULE | Freq: Every day | ORAL | 6 refills | Status: DC

## 2021-05-02 NOTE — Progress Notes (Signed)
   LOW-RISK PREGNANCY VISIT Patient name: Claudia Marks MRN [id number]  Date of birth: [birth year] Chief Complaint:   Routine Prenatal Visit  History of Present Illness:   Claudia Marks is a 25 y.o. G3P1001 female at 54w6dwith an Estimated Date of Delivery: 05/10/21 being seen today for ongoing management of a low-risk pregnancy.  [facility] 01/31/2021 10/31/2020 06/06/2018  Decreased Interest 0 2 0  Down, Depressed, Hopeless 0 1 0  PHQ - 2 Score 0 3 0  Altered sleeping 1 1 -  Tired, decreased energy 1 3 -  Change in appetite 0 3 -  Feeling bad or failure about yourself  0 0 -  Trouble concentrating 0 2 -  Moving slowly or fidgety/restless 0 0 -  Suicidal thoughts 0 0 -  PHQ-9 Score 2 12 -    Today she reports no complaints. Contractions: Not present. Vag. Bleeding: None.  Movement: Present. denies leaking of fluid. Review of Systems:   Pertinent items are noted in HPI Denies abnormal vaginal discharge w/ itching/odor/irritation, headaches, visual changes, shortness of breath, chest pain, abdominal pain, severe nausea/vomiting, or problems with urination or bowel movements unless otherwise stated above. Pertinent History Reviewed:  Reviewed past medical,surgical, social, obstetrical and family history.  Reviewed problem list, medications and allergies. Physical Assessment:   Vitals:   05/02/21 1024  BP: 106/69  Pulse: 78  Weight: 144 lb (65.3 kg)  Body mass index is 26.34 kg/m.        Physical Examination:   General appearance: Well appearing, and in no distress  Mental status: Alert, oriented to person, place, and time  Skin: Warm & dry  Cardiovascular: Normal heart rate noted  Respiratory: Normal respiratory effort, no distress  Abdomen: Soft, gravid, nontender  Pelvic: Cervical exam deferred         Extremities: Edema: None  Fetal Status: Fetal Heart Rate (bpm): 140 Fundal Height: 35 cm Movement: Present Presentation: Vertex  Chaperone: n/a    No  results found for this or any previous visit (from the past 24 hour(s)).  Assessment & Plan:  1) Low-risk pregnancy G2P1001 at 311w6dith an Estimated Date of Delivery: 05/10/21      Meds:  Meds ordered this encounter  Medications   omeprazole (PRILOSEC) 20 MG capsule    Sig: Take 1 capsule (20 mg total) by mouth daily. 1 tablet a day    Dispense:  30 capsule    Refill:  6    Labs/procedures today:   Plan:  Continue routine obstetrical care  Next visit: prefers in person    Reviewed: Term labor symptoms and general obstetric precautions including but not limited to vaginal bleeding, contractions, leaking of fluid and fetal movement were reviewed in detail with the patient.  All questions were answered. Has home bp cuff. Rx faxed to . Check bp weekly, let usKoreanow if >140/90.   Follow-up: Return in about 1 week (around 05/09/2021) for NST, LROB.  No orders of the defined types were placed in this encounter.   LuFlorian BuffMD 05/02/2021 11:04 AM

## 2021-05-09 ENCOUNTER — Encounter (HOSPITAL_COMMUNITY): Payer: Self-pay | Admitting: Family Medicine

## 2021-05-09 ENCOUNTER — Inpatient Hospital Stay (HOSPITAL_COMMUNITY)
Admission: AD | Admit: 2021-05-09 | Discharge: 2021-05-10 | DRG: 807 | Disposition: A | Discharge status: Home / Self Care | Payer: BC Managed Care – PPO | Attending: Family Medicine | Admitting: Family Medicine

## 2021-05-09 ENCOUNTER — Other Ambulatory Visit: Payer: BC Managed Care – PPO

## 2021-05-09 ENCOUNTER — Other Ambulatory Visit: Payer: Self-pay

## 2021-05-09 ENCOUNTER — Inpatient Hospital Stay (HOSPITAL_COMMUNITY): Payer: BC Managed Care – PPO | Admitting: Anesthesiology

## 2021-05-09 DIAGNOSIS — Z30017 Encounter for initial prescription of implantable subdermal contraceptive: Secondary | ICD-10-CM | POA: Diagnosis not present

## 2021-05-09 DIAGNOSIS — D6959 Other secondary thrombocytopenia: Secondary | ICD-10-CM | POA: Diagnosis present

## 2021-05-09 DIAGNOSIS — Z3480 Encounter for supervision of other normal pregnancy, unspecified trimester: Secondary | ICD-10-CM

## 2021-05-09 DIAGNOSIS — Z3A39 39 weeks gestation of pregnancy: Secondary | ICD-10-CM | POA: Diagnosis not present

## 2021-05-09 DIAGNOSIS — O9912 Other diseases of the blood and blood-forming organs and certain disorders involving the immune mechanism complicating childbirth: Secondary | ICD-10-CM | POA: Diagnosis not present

## 2021-05-09 DIAGNOSIS — O26893 Other specified pregnancy related conditions, third trimester: Secondary | ICD-10-CM | POA: Diagnosis not present

## 2021-05-09 DIAGNOSIS — Z20822 Contact with and (suspected) exposure to covid-19: Secondary | ICD-10-CM | POA: Diagnosis not present

## 2021-05-09 DIAGNOSIS — N898 Other specified noninflammatory disorders of vagina: Secondary | ICD-10-CM

## 2021-05-09 LAB — RESP PANEL BY RT-PCR (FLU A&B, COVID) ARPGX2
Influenza A by PCR: NEGATIVE
Influenza B by PCR: NEGATIVE
SARS Coronavirus 2 by RT PCR: NEGATIVE

## 2021-05-09 LAB — TYPE AND SCREEN
ABO/RH(D): B POS
Antibody Screen: NEGATIVE

## 2021-05-09 LAB — CBC
HCT: 32.5 % — ABNORMAL LOW (ref 36.0–46.0)
Hemoglobin: 10.6 g/dL — ABNORMAL LOW (ref 12.0–15.0)
MCH: 29.4 pg (ref 26.0–34.0)
MCHC: 32.6 g/dL (ref 30.0–36.0)
MCV: 90.3 fL (ref 80.0–100.0)
Platelets: 121 10*3/uL — ABNORMAL LOW (ref 150–400)
RBC: 3.6 MIL/uL — ABNORMAL LOW (ref 3.87–5.11)
RDW: 13.1 % (ref 11.5–15.5)
WBC: 9.1 10*3/uL (ref 4.0–10.5)
nRBC: 0 % (ref 0.0–0.2)

## 2021-05-09 LAB — RPR: RPR Ser Ql: NONREACTIVE

## 2021-05-09 LAB — POCT FERN TEST: POCT Fern Test: NEGATIVE

## 2021-05-09 MED ORDER — FENTANYL-BUPIVACAINE-NACL 0.5-0.125-0.9 MG/250ML-% EP SOLN
12.0000 mL/h | EPIDURAL | Status: DC | PRN
  Administered 2021-05-09: 12 mL/h via EPIDURAL
  Filled 2021-05-09: qty 250

## 2021-05-09 MED ORDER — TETANUS-DIPHTH-ACELL PERTUSSIS 5-2.5-18.5 LF-MCG/0.5 IM SUSY: 0.5000 mL | PREFILLED_SYRINGE | Freq: Once | INTRAMUSCULAR | Status: DC

## 2021-05-09 MED ORDER — SOD CITRATE-CITRIC ACID 500-334 MG/5ML PO SOLN: 30.0000 mL | ORAL | Status: DC | PRN

## 2021-05-09 MED ORDER — DIPHENHYDRAMINE HCL 50 MG/ML IJ SOLN: 12.5000 mg | INTRAMUSCULAR | Status: DC | PRN

## 2021-05-09 MED ORDER — LACTATED RINGERS IV SOLN
500.0000 mL | INTRAVENOUS | Status: DC | PRN
  Administered 2021-05-09: 500 mL via INTRAVENOUS

## 2021-05-09 MED ORDER — PRENATAL MULTIVITAMIN CH
1.0000 | ORAL_TABLET | Freq: Every day | ORAL | Status: DC
  Administered 2021-05-10: 1 via ORAL
  Filled 2021-05-09: qty 1

## 2021-05-09 MED ORDER — BENZOCAINE-MENTHOL 20-0.5 % EX AERO
1.0000 "application " | INHALATION_SPRAY | CUTANEOUS | Status: DC | PRN
  Administered 2021-05-09: 1 via TOPICAL
  Filled 2021-05-09: qty 56

## 2021-05-09 MED ORDER — OXYCODONE-ACETAMINOPHEN 5-325 MG PO TABS: 2.0000 | ORAL_TABLET | ORAL | Status: DC | PRN

## 2021-05-09 MED ORDER — OXYCODONE-ACETAMINOPHEN 5-325 MG PO TABS
1.0000 | ORAL_TABLET | Freq: Once | ORAL | Status: AC
Start: 2021-05-09 — End: 2021-05-09
  Administered 2021-05-09: 1 via ORAL
  Filled 2021-05-09: qty 1

## 2021-05-09 MED ORDER — ACETAMINOPHEN 325 MG PO TABS
650.0000 mg | ORAL_TABLET | ORAL | Status: DC | PRN
  Administered 2021-05-09 – 2021-05-10 (×3): 650 mg via ORAL
  Filled 2021-05-09 (×3): qty 2

## 2021-05-09 MED ORDER — ACETAMINOPHEN 325 MG PO TABS: 650.0000 mg | ORAL_TABLET | ORAL | Status: DC | PRN

## 2021-05-09 MED ORDER — SIMETHICONE 80 MG PO CHEW: 80.0000 mg | CHEWABLE_TABLET | ORAL | Status: DC | PRN

## 2021-05-09 MED ORDER — ONDANSETRON HCL 4 MG/2ML IJ SOLN
4.0000 mg | Freq: Four times a day (QID) | INTRAMUSCULAR | Status: DC | PRN
  Administered 2021-05-09: 4 mg via INTRAVENOUS
  Filled 2021-05-09: qty 2

## 2021-05-09 MED ORDER — DIBUCAINE (PERIANAL) 1 % EX OINT: 1.0000 "application " | TOPICAL_OINTMENT | CUTANEOUS | Status: DC | PRN

## 2021-05-09 MED ORDER — ONDANSETRON HCL 4 MG PO TABS: 4.0000 mg | ORAL_TABLET | ORAL | Status: DC | PRN

## 2021-05-09 MED ORDER — ONDANSETRON HCL 4 MG/2ML IJ SOLN: 4.0000 mg | INTRAMUSCULAR | Status: DC | PRN

## 2021-05-09 MED ORDER — COCONUT OIL OIL: 1.0000 "application " | TOPICAL_OIL | Status: DC | PRN

## 2021-05-09 MED ORDER — WITCH HAZEL-GLYCERIN EX PADS: 1.0000 "application " | MEDICATED_PAD | CUTANEOUS | Status: DC | PRN

## 2021-05-09 MED ORDER — ZOLPIDEM TARTRATE 5 MG PO TABS: 5.0000 mg | ORAL_TABLET | Freq: Every evening | ORAL | Status: DC | PRN

## 2021-05-09 MED ORDER — FENTANYL CITRATE (PF) 100 MCG/2ML IJ SOLN
50.0000 ug | INTRAMUSCULAR | Status: DC | PRN
  Administered 2021-05-09: 100 ug via INTRAVENOUS
  Administered 2021-05-09: 50 ug via INTRAVENOUS
  Filled 2021-05-09 (×2): qty 2

## 2021-05-09 MED ORDER — FENTANYL-BUPIVACAINE-NACL 0.5-0.125-0.9 MG/250ML-% EP SOLN: 12.0000 mL/h | EPIDURAL | Status: DC | PRN

## 2021-05-09 MED ORDER — LIDOCAINE HCL (PF) 1 % IJ SOLN
INTRAMUSCULAR | Status: DC | PRN
  Administered 2021-05-09: 8 mL via EPIDURAL

## 2021-05-09 MED ORDER — OXYCODONE-ACETAMINOPHEN 5-325 MG PO TABS
1.0000 | ORAL_TABLET | ORAL | Status: DC | PRN
Start: 2021-05-09 — End: 2021-05-09

## 2021-05-09 MED ORDER — SENNOSIDES-DOCUSATE SODIUM 8.6-50 MG PO TABS
2.0000 | ORAL_TABLET | Freq: Every day | ORAL | Status: DC
  Administered 2021-05-10: 2 via ORAL
  Filled 2021-05-09: qty 2

## 2021-05-09 MED ORDER — OXYTOCIN-SODIUM CHLORIDE 30-0.9 UT/500ML-% IV SOLN
2.5000 [IU]/h | INTRAVENOUS | Status: DC
  Filled 2021-05-09: qty 500

## 2021-05-09 MED ORDER — PHENYLEPHRINE 40 MCG/ML (10ML) SYRINGE FOR IV PUSH (FOR BLOOD PRESSURE SUPPORT)
80.0000 ug | PREFILLED_SYRINGE | INTRAVENOUS | Status: DC | PRN
Start: 2021-05-09 — End: 2021-05-09

## 2021-05-09 MED ORDER — LACTATED RINGERS IV SOLN: 500.0000 mL | Freq: Once | INTRAVENOUS | Status: DC

## 2021-05-09 MED ORDER — PHENYLEPHRINE 40 MCG/ML (10ML) SYRINGE FOR IV PUSH (FOR BLOOD PRESSURE SUPPORT): 80.0000 ug | PREFILLED_SYRINGE | INTRAVENOUS | Status: DC | PRN

## 2021-05-09 MED ORDER — EPHEDRINE 5 MG/ML INJ: 10.0000 mg | INTRAVENOUS | Status: DC | PRN

## 2021-05-09 MED ORDER — LACTATED RINGERS IV SOLN: INTRAVENOUS | Status: DC

## 2021-05-09 MED ORDER — DIPHENHYDRAMINE HCL 25 MG PO CAPS: 25.0000 mg | ORAL_CAPSULE | Freq: Four times a day (QID) | ORAL | Status: DC | PRN

## 2021-05-09 MED ORDER — LIDOCAINE HCL (PF) 1 % IJ SOLN: 30.0000 mL | INTRAMUSCULAR | Status: DC | PRN

## 2021-05-09 MED ORDER — OXYTOCIN BOLUS FROM INFUSION
333.0000 mL | Freq: Once | INTRAVENOUS | Status: AC
  Administered 2021-05-09: 333 mL via INTRAVENOUS

## 2021-05-09 MED ORDER — IBUPROFEN 600 MG PO TABS
600.0000 mg | ORAL_TABLET | Freq: Four times a day (QID) | ORAL | Status: DC
  Administered 2021-05-09 – 2021-05-10 (×4): 600 mg via ORAL
  Filled 2021-05-09 (×4): qty 1

## 2021-05-09 NOTE — Progress Notes (Signed)
Labor Progress Note Claudia Marks is a 25 y.o. G2P1001 at 74w6dpresented for SOL.  S: Feeling increased pelvic pressure.   O:  BP (!) 109/59   Pulse (!) 106   Temp 97.8 F (36.6 C) (Oral)   Resp 16   Ht '5\' 2"'$  (1.575 m)   Wt 65.8 kg   LMP 08/03/2020   SpO2 98%   BMI 26.52 kg/m  EFM: 120/moderate/accels present  CVE: Dilation: 10 Dilation Complete Date: 05/09/21 Dilation Complete Time: 0953 Effacement (%): 100 Cervical Position: Posterior Station: Plus 2 Presentation: Vertex Exam by:: DRosana Hoes RN AROM at [phone] clear fluid and descent of head from -2 to 0 station.   A&P: 25y.o. G2P1001 373w6dere for SOL. #Labor: Progressing well. Expect labor to progress rapidly after AROM #Pain: Epidural #FWB: Cat I #GBS negative   BaPearla DubonnetMD 10:26 AM

## 2021-05-09 NOTE — Discharge Summary (Addendum)
Postpartum Discharge Summary      Patient Name: Claudia Marks DOB: [birth year] MRN: [id number]  Date of admission: 05/09/2021 Delivery date:05/09/2021  Delivering provider: Radene Gunning  Date of discharge: 05/10/2021  Admitting diagnosis: Indication for care in labor or delivery [O75.9] Intrauterine pregnancy: [redacted]w[redacted]d     Secondary diagnosis:  Active Problems:   Indication for care in labor or delivery  Additional problems: Gestational Thrombocytopenia   Discharge diagnosis: Term Pregnancy Delivered and gestational thrombocytopenia                                                Post partum procedures: Nexplanon insertion Augmentation: AROM Complications: None  Hospital course: Onset of Labor With Vaginal Delivery      25 y.o. yo G2P1001 at [redacted]w[redacted]d was admitted in Active Labor on 05/09/2021. Patient had an uncomplicated labor course as follows:  Membrane Rupture Time/Date: 9:43 AM ,05/09/2021   Delivery Method:Vaginal, Spontaneous  Episiotomy: None  Lacerations:  None  Patient had an uncomplicated postpartum course.  She is ambulating, tolerating a regular diet, passing flatus, and urinating well. Patient also reports she has had a bowel movement since delivery. Patient is discharged home in stable condition on 05/10/21.  Newborn Data: Birth date:05/09/2021  Birth time:10:03 AM  Gender:Female  Living status:Living  Apgars:8 ,9  Weight:3269 g   Magnesium Sulfate received: No BMZ received: No Rhophylac:N/A MMR:N/A T-DaP:Given prenatally Flu: N/A Transfusion:No  Physical exam  Vitals:   05/09/21 1623 05/09/21 2005 05/09/21 2348 05/10/21 0513  BP: 108/74 109/75 115/76 115/84  Pulse: 71 79 83 60  Resp: $Remo'18 18 18 18  'KuNkw$ Temp: 97.7 F (36.5 C) 98.3 F (36.8 C) 98 F (36.7 C) 98.4 F (36.9 C)  TempSrc: Oral Oral Oral Oral  SpO2:  100% 100% 100%  Weight:      Height:       General: alert, cooperative, and no distress Lochia: appropriate Uterine Fundus: firm Incision:  N/A DVT Evaluation: No evidence of DVT seen on physical exam. Labs: Lab Results  Component Value Date   WBC 8.3 05/10/2021   HGB 9.4 (L) 05/10/2021   HCT 28.2 (L) 05/10/2021   MCV 89.5 05/10/2021   PLT 108 (L) 05/10/2021   CMP Latest Ref Rng & Units 04/14/2012  Glucose 70 - 99 mg/dL 114(H)  BUN 6 - 23 mg/dL 9  Creatinine 0.47 - 1.00 mg/dL 0.66  Sodium 135 - 145 mEq/L 133(L)  Potassium 3.5 - 5.1 mEq/L 4.0  Chloride 96 - 112 mEq/L 99  CO2 19 - 32 mEq/L 24  Calcium 8.4 - 10.5 mg/dL 9.7  Total Protein 6.0 - 8.3 g/dL 8.2  Total Bilirubin 0.3 - 1.2 mg/dL 0.4  Alkaline Phos 50 - 162 U/L 74  AST 0 - 37 U/L 21  ALT 0 - 35 U/L 29   Edinburgh Score: Edinburgh Postnatal Depression Scale Screening Tool 05/10/2021  I have been able to laugh and see the funny side of things. 0  I have looked forward with enjoyment to things. 0  I have blamed myself unnecessarily when things went wrong. 1  I have been anxious or worried for no good reason. 2  I have felt scared or panicky for no good reason. 0  Things have been getting on top of me. 1  I have been so unhappy that I have had difficulty sleeping. 0  I have felt sad or miserable. 0  I have been so unhappy that I have been crying. 0  The thought of harming myself has occurred to me. 0  Edinburgh Postnatal Depression Scale Total 4     After visit meds:  Allergies as of 05/10/2021   No Known Allergies      Medication List     STOP taking these medications    calcium carbonate 500 MG chewable tablet Commonly known as: TUMS - dosed in mg elemental calcium   omeprazole 20 MG capsule Commonly known as: PRILOSEC       TAKE these medications    acetaminophen 325 MG tablet Commonly known as: Tylenol Take 2 tablets (650 mg total) by mouth every 6 (six) hours as needed (for pain scale < 4).   ibuprofen 600 MG tablet Commonly known as: ADVIL Take 1 tablet (600 mg total) by mouth every 6 (six) hours.   PRENATAL VITAMIN PO Take 1  tablet by mouth daily.       Discharge home in stable condition Infant Feeding: Bottle Infant Disposition:home with mother Discharge instruction: per After Visit Summary and Postpartum booklet. Activity: Advance as tolerated. Pelvic rest for 6 weeks.  Diet: routine diet Future Appointments: Future Appointments  Date Time Provider [place]  06/16/2021 11:50 AM Roma Schanz, CNM [facility]   Follow up Visit:  Follow-up Information     [facility] Follow up on 06/16/2021.   Contact information: [address] [place] [address] [phone]                Please schedule this patient for Postpartum visit in: 4 weeks with the following provider: Any provider In-Person For C/S patients schedule nurse incision check in weeks 2 weeks: no Low risk pregnancy complicated by: gestational thrombocytopenia  Delivery mode:  SVD Anticipated Birth Control:  planning for PP nexplanon prior to DC  PP Procedures needed: none  Edinburgh: not done at this time Schedule Integrated [place] visit: no  NA relevant baby issues     Attestation of Attending Supervision of Advanced Practice Provider (PA/CNM/NP): Evaluation and management procedures were performed by the Advanced Practice Provider under my supervision and collaboration.  I have reviewed the Advanced Practice Provider's note and chart, and I agree with the management and plan. I have also made any necessary editorial changes.   Griffin Basil, MD Attending [place], [facility], [place] [facility] 05/10/2021 3:17 PM  Griffin Basil, MD  3:16 PM 05/10/2021

## 2021-05-09 NOTE — Anesthesia Preprocedure Evaluation (Signed)
Anesthesia Evaluation  Patient identified by MRN, date of birth, ID band Patient awake    Reviewed: Allergy & Precautions, H&P , NPO status , Patient's Chart, lab work & pertinent test results, reviewed documented beta blocker date and time   Airway Mallampati: I  TM Distance: >3 FB Neck ROM: full    Dental no notable dental hx. (+) Teeth Intact, Dental Advisory Given   Pulmonary neg pulmonary ROS,    Pulmonary exam normal breath sounds clear to auscultation       Cardiovascular negative cardio ROS Normal cardiovascular exam Rhythm:regular Rate:Normal     Neuro/Psych negative neurological ROS  negative psych ROS   GI/Hepatic negative GI ROS, Neg liver ROS,   Endo/Other  negative endocrine ROS  Renal/GU negative Renal ROS  negative genitourinary   Musculoskeletal   Abdominal   Peds  Hematology negative hematology ROS (+)   Anesthesia Other Findings   Reproductive/Obstetrics (+) Pregnancy                             Anesthesia Physical Anesthesia Plan  ASA: 2  Anesthesia Plan: Epidural   Post-op Pain Management:    Induction:   PONV Risk Score and Plan:   Airway Management Planned:   Additional Equipment:   Intra-op Plan:   Post-operative Plan:   Informed Consent: I have reviewed the patients History and Physical, chart, labs and discussed the procedure including the risks, benefits and alternatives for the proposed anesthesia with the patient or authorized representative who has indicated his/her understanding and acceptance.       Plan Discussed with:   Anesthesia Plan Comments:         Anesthesia Quick Evaluation

## 2021-05-09 NOTE — Anesthesia Procedure Notes (Signed)
Epidural Patient location during procedure: OB Start time: 05/09/2021 7:17 AM End time: 05/09/2021 7:25 AM  Staffing Anesthesiologist: Janeece Riggers, MD  Preanesthetic Checklist Completed: patient identified, IV checked, site marked, risks and benefits discussed, surgical consent, monitors and equipment checked, pre-op evaluation and timeout performed  Epidural Patient position: sitting Prep: DuraPrep and site prepped and draped Patient monitoring: continuous pulse ox and blood pressure Approach: midline Location: L4-L5 Injection technique: LOR air  Needle:  Needle type: Tuohy  Needle gauge: 17 G Needle length: 9 cm and 9 Needle insertion depth: 5 cm cm Catheter type: closed end flexible Catheter size: 19 Gauge Catheter at skin depth: 10 cm Test dose: negative  Assessment Events: blood not aspirated, injection not painful, no injection resistance, no paresthesia and negative IV test

## 2021-05-09 NOTE — MAU Note (Signed)
Pt c/o ctx every 5-7 min. Unsure if fluid is leaking or discharge. No bleeding +FM.

## 2021-05-09 NOTE — H&P (Addendum)
OBSTETRIC ADMISSION HISTORY AND PHYSICAL  Claudia Marks is a 25 y.o. female G2P1001 with IUP at 78w6dby LMP presenting for SOL. She reports +FMs, no LOF, no VB, no blurry vision, headaches, peripheral edema, or RUQ pain.  She plans on formula feeding. She requests PP Nexplanon for birth control.  [facility]  Dating: By LMP --->  Estimated Date of Delivery: 05/10/21  Sono:   '@[redacted]w[redacted]d'$ , CWD, normal anatomy, cephalic presentation, posterior placental lie, 296g, 33% EFW  Prenatal History/Complications:  Gestational Thrombocytopenia  Past Medical History: Past Medical History:  Diagnosis Date   Headache(784.0)    frequent headaches    Past Surgical History: Past Surgical History:  Procedure Laterality Date   BREAST SURGERY Left 07/20/13   Left breast excisional   EXCISION OF BREAST BIOPSY Left 07/20/2013   Procedure: EXCISION OF BREAST BIOPSY;  Surgeon: BMadilyn Hook DO;  Location: [facility];  Service: General;  Laterality: Left;   no previous surgery      Obstetrical History: OB History     Gravida  2   Para  1   Term  1   Preterm      AB      Living  1      SAB      IAB      Ectopic      Multiple      Live Births  1           Social History Social History   Socioeconomic History   Marital status: Single    Spouse name: Not on file   Number of children: Not on file   Years of education: Not on file   Highest education level: Not on file  Occupational History   Not on file  Tobacco Use   Smoking status: Never   Smokeless tobacco: Never  Vaping Use   Vaping Use: Never used  Substance and Sexual Activity   Alcohol use: No   Drug use: No   Sexual activity: Yes    Birth control/protection: None  Other Topics Concern   Not on file  Social History Narrative   Not on file   Social Determinants of Health   Financial Resource Strain: Low Risk    Difficulty of Paying Living Expenses: Not very hard  Food  Insecurity: No Food Insecurity   Worried About RCharity fundraiserin the Last Year: Never true   Ran Out of Food in the Last Year: Never true  Transportation Needs: No Transportation Needs   Lack of Transportation (Medical): No   Lack of Transportation (Non-Medical): No  Physical Activity: Insufficiently Active   Days of Exercise per Week: 4 days   Minutes of Exercise per Session: 30 min  Stress: No Stress Concern Present   Feeling of Stress : Only a little  Social Connections: Moderately Isolated   Frequency of Communication with Friends and Family: Twice a week   Frequency of Social Gatherings with Friends and Family: Three times a week   Attends Religious Services: More than 4 times per year   Active Member of Clubs or Organizations: No   Attends CArchivistMeetings: Never   Marital Status: Never married    Family History: Family History  Problem Relation Age of Onset   Arthritis Maternal Grandmother    Depression Maternal Grandmother    Hypertension Maternal Grandmother    Hypertension Paternal Grandmother    Arthritis Maternal Uncle  Allergies: No Known Allergies  Medications Prior to Admission  Medication Sig Dispense Refill Last Dose   calcium carbonate (TUMS - DOSED IN MG ELEMENTAL CALCIUM) 500 MG chewable tablet Chew 1 tablet by mouth daily.   05/08/2021   Prenatal Vit-Fe Fumarate-FA (PRENATAL VITAMIN PO) Take by mouth.   05/08/2021   omeprazole (PRILOSEC) 20 MG capsule Take 1 capsule (20 mg total) by mouth daily. 1 tablet a day 30 capsule 6      Review of Systems  All systems reviewed and negative except as stated in HPI  Blood pressure 119/78, pulse 74, temperature 97.9 F (36.6 C), temperature source Oral, resp. rate 18, height '5\' 2"'$  (1.575 m), weight 65.8 kg, last menstrual period 08/03/2020, SpO2 98 %.  General appearance: alert, cooperative, and no distress Lungs: breathing comfortably on room air  Heart: regular rate Extremities: no LE  edema or calf tenderness to palpation  Presentation: cephalic Fetal monitoring: Baseline: 130 bpm, Variability: Good {> 6 bpm), Accelerations: Reactive, and Decelerations: Early Uterine activity: Every 2-9 minutes Dilation: 6 Effacement (%): 70 Station: -2 Exam by: K.Louis-Charles, RN   Prenatal labs: ABO, Rh: --/--/PENDING (08/19 [id number]) Antibody: PENDING (08/19 0509) Rubella: 1.04 (02/10 1249) RPR: Non Reactive (05/13 0822)  HBsAg: Negative (02/10 1249)  HIV: Non Reactive (05/13 [id number])  GBS: Negative/-- (07/28 1119)  2 hr Glucola Normal Genetic screening  Negative Anatomy US Normal  Prenatal Transfer Tool  Maternal Diabetes: No Genetic Screening: Normal Maternal Ultrasounds/Referrals: Normal Fetal Ultrasounds or other Referrals:  None Maternal Substance Abuse:  No Significant Maternal Medications:  None Significant Maternal Lab Results: Group B Strep negative  Results for orders placed or performed during the hospital encounter of 05/09/21 (from the past 24 hour(s))  POCT fern test   Collection Time: 05/09/21  2:52 AM  Result Value Ref Range   POCT Fern Test Negative = intact amniotic membranes   Resp Panel by RT-PCR (Flu A&B, Covid) Nasopharyngeal Swab   Collection Time: 05/09/21  4:53 AM   Specimen: Nasopharyngeal Swab; Nasopharyngeal(NP) swabs in vial transport medium  Result Value Ref Range   SARS Coronavirus 2 by RT PCR NEGATIVE NEGATIVE   Influenza A by PCR NEGATIVE NEGATIVE   Influenza B by PCR NEGATIVE NEGATIVE  CBC   Collection Time: 05/09/21  5:09 AM  Result Value Ref Range   WBC 9.1 4.0 - 10.5 K/uL   RBC 3.60 (L) 3.87 - 5.11 MIL/uL   Hemoglobin 10.6 (L) 12.0 - 15.0 g/dL   HCT 32.5 (L) 36.0 - 46.0 %   MCV 90.3 80.0 - 100.0 fL   MCH 29.4 26.0 - 34.0 pg   MCHC 32.6 30.0 - 36.0 g/dL   RDW 13.1 11.5 - 15.5 %   Platelets 121 (L) 150 - 400 K/uL   nRBC 0.0 0.0 - 0.2 %  Type and screen [place]   Collection Time: 05/09/21  5:09 AM   Result Value Ref Range   ABO/RH(D) PENDING    Antibody Screen PENDING    Sample Expiration      05/12/2021,2359 Performed at [facility], [address] [place], [place] [address]     Patient Active Problem List   Diagnosis Date Noted   Gestational thrombocytopenia ([place]) 02/03/2021   Encounter for supervision of normal pregnancy, antepartum 10/31/2020    Assessment/Plan:  LINNE WITTENMYER is a 25 y.o. G2P1001 at 65w6dhere for SOL.  #Labor:  Progressing well; active labor. Will continue expectant management.  #Pain:  Epidural in place #FWB: Cat 1 #ID: GBS negative #MOF: Formula #MOC:  Desires PP Nexplanon (will confirm insurance coverage)  #Circ: N/A #Gestational Thrombocytpenia:  121 on admission.  Plan to obtain PP CBC.  Delora Fuel, MD  05/09/2021, 6:10 AM    GME ATTESTATION:  I saw and evaluated the patient. I agree with the findings and the plan of care as documented in the resident's note. I have made changes to documentation as necessary.   Vilma Meckel, MD OB Fellow, [place] for [place] 05/09/2021 9:02 AM

## 2021-05-09 NOTE — MAU Provider Note (Signed)
Chief Complaint:  Contractions   Event Date/Time   First Provider Initiated Contact with Patient 05/09/21 0135     HPI: Claudia Marks is a 25 y.o. G2P1001 at 24w6dho presents to maternity admissions reporting labor.  While being evaluated, told RN that she has been leaking fluid for a week. . She reports good fetal movement, denies LOF, vaginal bleeding, vaginal itching/burning, urinary symptoms, h/a, dizziness, n/v, diarrhea, constipation or fever/chills.  She denies headache, visual changes or RUQ abdominal pain.  Vaginal Discharge The patient's primary symptoms include pelvic pain and vaginal discharge. The patient's pertinent negatives include no genital itching, genital lesions or genital odor. This is a new problem. The current episode started in the past 7 days. Pertinent negatives include no anorexia, chills, constipation, diarrhea, fever or frequency. The vaginal discharge was clear and mucoid. There has been no bleeding. She has not been passing clots. She has not been passing tissue. Nothing aggravates the symptoms. She has tried nothing for the symptoms.    Past Medical History: Past Medical History:  Diagnosis Date   Headache(784.0)    frequent headaches    Past obstetric history: OB History  Gravida Para Term Preterm AB Living  '2 1 1     1  '$ SAB IAB Ectopic Multiple Live Births          1    # Outcome Date GA Lbr Len/2nd Weight Sex Delivery Anes PTL Lv  2 Current           1 Term 01/27/15 427w2d7:03 / 01:00 3737 g F Vag-Spont EPI N LIV    Past Surgical History: Past Surgical History:  Procedure Laterality Date   BREAST SURGERY Left 07/20/13   Left breast excisional   EXCISION OF BREAST BIOPSY Left 07/20/2013   Procedure: EXCISION OF BREAST BIOPSY;  Surgeon: BrMadilyn HookDO;  Location: [facility];  Service: General;  Laterality: Left;   no previous surgery      Family History: Family History  Problem Relation Age of Onset   Arthritis Maternal Grandmother     Depression Maternal Grandmother    Hypertension Maternal Grandmother    Hypertension Paternal Grandmother    Arthritis Maternal Uncle     Social History: Social History   Tobacco Use   Smoking status: Never   Smokeless tobacco: Never  Vaping Use   Vaping Use: Never used  Substance Use Topics   Alcohol use: No   Drug use: No    Allergies: No Known Allergies  Meds:  Medications Prior to Admission  Medication Sig Dispense Refill Last Dose   calcium carbonate (TUMS - DOSED IN MG ELEMENTAL CALCIUM) 500 MG chewable tablet Chew 1 tablet by mouth daily.   05/08/2021   Prenatal Vit-Fe Fumarate-FA (PRENATAL VITAMIN PO) Take by mouth.   05/08/2021   omeprazole (PRILOSEC) 20 MG capsule Take 1 capsule (20 mg total) by mouth daily. 1 tablet a day 30 capsule 6     I have reviewed patient's Past Medical Hx, Surgical Hx, Family Hx, Social Hx, medications and allergies.   ROS:  Review of Systems  Constitutional:  Negative for chills and fever.  Gastrointestinal:  Negative for anorexia, constipation and diarrhea.  Genitourinary:  Positive for pelvic pain and vaginal discharge. Negative for frequency.  Other systems negative  Physical Exam  Patient Vitals for the past 24 hrs:  BP Temp Temp src Pulse Resp SpO2 Height Weight  05/09/21 0101 113/64 97.9 F (36.6 C) Oral 90 18 -- -- --  05/09/21 0026 123/80 97.8 F (36.6 C) Oral 80 18 98 % '5\' 2"'$  (1.575 m) 65.8 kg   Constitutional: Well-developed, well-nourished female in no acute distress.  Cardiovascular: normal rate and rhythm Respiratory: normal effort, clear to auscultation bilaterally GI: Abd soft, non-tender, gravid appropriate for gestational age.   No rebound or guarding. MS: Extremities nontender, no edema, normal ROM Neurologic: Alert and oriented x 4.  GU: Neg CVAT.  PELVIC EXAM: Cervix pink, visually closed, without lesion, scant white thick discharge, vaginal walls and external genitalia normal   No pooling, no  ferning  Dilation: 1.5 Effacement (%): 60 Cervical Position: Posterior Station: -3 Presentation: Vertex Exam by:: Youlanda Roys, RN  Dilation: 4.5 Effacement (%): 70 Cervical Position: Posterior Station: -2 Presentation: Vertex Exam by:: Youlanda Roys, RN  FHT:  Baseline 140 , moderate variability, accelerations present, no decelerations Contractions: q 2-4 mins Irregular     Labs: No results found for this or any previous visit (from the past 24 hour(s)). B/Positive/-- (02/10 1249)  Imaging:  No results found.  MAU Course/MDM: NST reviewed, reactive.  Treatments in MAU included EFM, SSE.    Assessment: Single IUP at 75w6dVaginal discharge, no evidence of ROM  Plan: Admit for labor Labor team to follow   MHansel FeinsteinCNM, MSN Certified Nurse-Midwife 05/09/2021 1:35 AM

## 2021-05-10 DIAGNOSIS — Z30017 Encounter for initial prescription of implantable subdermal contraceptive: Secondary | ICD-10-CM

## 2021-05-10 LAB — CBC
HCT: 28.2 % — ABNORMAL LOW (ref 36.0–46.0)
Hemoglobin: 9.4 g/dL — ABNORMAL LOW (ref 12.0–15.0)
MCH: 29.8 pg (ref 26.0–34.0)
MCHC: 33.3 g/dL (ref 30.0–36.0)
MCV: 89.5 fL (ref 80.0–100.0)
Platelets: 108 10*3/uL — ABNORMAL LOW (ref 150–400)
RBC: 3.15 MIL/uL — ABNORMAL LOW (ref 3.87–5.11)
RDW: 13.2 % (ref 11.5–15.5)
WBC: 8.3 10*3/uL (ref 4.0–10.5)
nRBC: 0 % (ref 0.0–0.2)

## 2021-05-10 MED ORDER — ETONOGESTREL 68 MG ~~LOC~~ IMPL
68.0000 mg | DRUG_IMPLANT | Freq: Once | SUBCUTANEOUS | Status: AC
  Administered 2021-05-10: 68 mg via SUBCUTANEOUS
  Filled 2021-05-10: qty 1

## 2021-05-10 MED ORDER — ACETAMINOPHEN 325 MG PO TABS: 650.0000 mg | ORAL_TABLET | ORAL | Status: DC | PRN

## 2021-05-10 MED ORDER — SIMETHICONE 80 MG PO CHEW: 80.0000 mg | CHEWABLE_TABLET | ORAL | 0 refills | Status: DC | PRN

## 2021-05-10 MED ORDER — IBUPROFEN 600 MG PO TABS: 600.0000 mg | ORAL_TABLET | Freq: Four times a day (QID) | ORAL | 0 refills | Status: DC

## 2021-05-10 MED ORDER — LIDOCAINE HCL 1 % IJ SOLN
0.0000 mL | Freq: Once | INTRAMUSCULAR | Status: AC | PRN
  Administered 2021-05-10: 20 mL via INTRADERMAL
  Filled 2021-05-10: qty 20

## 2021-05-10 MED ORDER — ACETAMINOPHEN 325 MG PO TABS: 650.0000 mg | ORAL_TABLET | Freq: Four times a day (QID) | ORAL | Status: AC | PRN

## 2021-05-10 NOTE — Procedures (Signed)
     Postpartum nexplanon placement  Claudia Marks is a 25 y.o. [id number] here  Nexplanon insertion.  Last pap smear was on 06/06/2018 and was normal.  No other gynecologic concerns.  Nexplanon Insertion Procedure Patient identified, informed consent performed, consent signed.   Patient does understand that irregular bleeding is a very common side effect of this medication. She was advised to have backup contraception for one week after placement. Pregnancy test in clinic today was negative.  Appropriate time out taken.  Patient's left arm was prepped and draped in the usual sterile fashion. The ruler used to measure and mark insertion area.  Patient was prepped with alcohol swab and then injected with 5 ml of 1% lidocaine.  She was prepped with betadine, Nexplanon removed from packaging,  Device confirmed in needle, then inserted full length of needle and withdrawn per handbook instructions. Nexplanon was able to palpated in the patient's arm; patient palpated the insert herself. There was minimal blood loss.  Patient insertion site covered with guaze and a pressure bandage to reduce any bruising.  The patient tolerated the procedure well and was given post procedure instructions. Recommned pap smear at postpartum visit.    Claudia Shields, MD, [place] for [facility], [place]

## 2021-05-10 NOTE — Anesthesia Postprocedure Evaluation (Signed)
Anesthesia Post Note  Patient: Claudia Marks  Procedure(s) Performed: AN AD HOC LABOR EPIDURAL     Patient location during evaluation: Mother Baby Anesthesia Type: Epidural Level of consciousness: awake and alert Pain management: pain level controlled Vital Signs Assessment: post-procedure vital signs reviewed and stable Respiratory status: spontaneous breathing, nonlabored ventilation and respiratory function stable Cardiovascular status: stable Postop Assessment: no headache, no backache and epidural receding Anesthetic complications: no   No notable events documented.  Last Vitals:  Vitals:   05/09/21 2348 05/10/21 0513  BP: 115/76 115/84  Pulse: 83 60  Resp: 18 18  Temp: 36.7 C 36.9 C  SpO2: 100% 100%    Last Pain:  Vitals:   05/10/21 0513  TempSrc: Oral  PainSc: 5    Pain Goal:                   Ishi Danser

## 2021-05-19 ENCOUNTER — Telehealth (HOSPITAL_COMMUNITY): Payer: Self-pay

## 2021-05-19 NOTE — Telephone Encounter (Signed)
No answer. Left message to return nurse call.  Sharyn Lull [facility] 05/19/2021,1740

## 2021-06-16 ENCOUNTER — Other Ambulatory Visit: Payer: Self-pay

## 2021-06-16 ENCOUNTER — Encounter: Payer: Self-pay | Admitting: Women's Health

## 2021-06-16 ENCOUNTER — Ambulatory Visit (INDEPENDENT_AMBULATORY_CARE_PROVIDER_SITE_OTHER): Payer: BC Managed Care – PPO | Admitting: Women's Health

## 2021-06-16 ENCOUNTER — Other Ambulatory Visit (HOSPITAL_COMMUNITY)
Admission: RE | Admit: 2021-06-16 | Discharge: 2021-06-16 | Disposition: A | Discharge status: Home / Self Care | Payer: Medicaid Other | Source: Ambulatory Visit | Attending: Women's Health | Admitting: Women's Health

## 2021-06-16 DIAGNOSIS — Z124 Encounter for screening for malignant neoplasm of cervix: Secondary | ICD-10-CM | POA: Diagnosis present

## 2021-06-16 DIAGNOSIS — D696 Thrombocytopenia, unspecified: Secondary | ICD-10-CM | POA: Diagnosis not present

## 2021-06-16 DIAGNOSIS — O99119 Other diseases of the blood and blood-forming organs and certain disorders involving the immune mechanism complicating pregnancy, unspecified trimester: Secondary | ICD-10-CM | POA: Diagnosis not present

## 2021-06-16 NOTE — Progress Notes (Signed)
POSTPARTUM VISIT Patient name: Claudia Marks MRN [id number]  Date of birth: 10-16-1995 Chief Complaint:   Postpartum Care (Got Nexplanon in hospital. )  History of Present Illness:   Claudia Marks is a 25 y.o. G57P2002 Caucasian female being seen today for a postpartum visit. She is 5 weeks postpartum following a spontaneous vaginal delivery at 39.6 gestational weeks. IOL: no, for n/a. Anesthesia: epidural.  Laceration: none.  Complications: none. Inpatient contraception: yes Nexplanon on 8/20 .   Pregnancy complicated by gestational thrombocytopenia . Tobacco use: no. Substance use disorder: no. Last pap smear: 06/06/18 and results were NILM w/ HRHPV not done. Next pap smear due: now No LMP recorded. Patient has had an implant.  Postpartum course has been uncomplicated. Bleeding less flow than a normal period. Bowel function is normal. Bladder function is normal. Urinary incontinence? no, fecal incontinence? no Patient is sexually active. Desired contraception: PP Nexplanon placed. Patient does not know want a pregnancy in the future.  Desired family size is uncertain #of children.   Upstream - 06/16/21 1201       Pregnancy Intention Screening   Does the patient want to become pregnant in the next year? No    Does the patient's partner want to become pregnant in the next year? No    Would the patient like to discuss contraceptive options today? No      Contraception Wrap Up   Current Method Hormonal Implant    End Method Hormonal Implant    Contraception Counseling Provided No            The pregnancy intention screening data noted above was reviewed. Potential methods of contraception were discussed. The patient elected to proceed with Hormonal Implant.  Edinburgh Postpartum Depression Screening: negative  Edinburgh Postnatal Depression Scale - 06/16/21 1202       Edinburgh Postnatal Depression Scale:  In the Past 7 Days   I have been able to laugh and see the funny side of  things. 0    I have looked forward with enjoyment to things. 0    I have blamed myself unnecessarily when things went wrong. 1    I have been anxious or worried for no good reason. 1    I have felt scared or panicky for no good reason. 0    Things have been getting on top of me. 0    I have been so unhappy that I have had difficulty sleeping. 0    I have felt sad or miserable. 0    I have been so unhappy that I have been crying. 0    The thought of harming myself has occurred to me. 0    Edinburgh Postnatal Depression Scale Total 2             GAD 7 : Generalized Anxiety Score 01/31/2021 10/31/2020  Nervous, Anxious, on Edge 1 1  Control/stop worrying 0 1  Worry too much - different things 0 1  Trouble relaxing 0 1  Restless 0 1  Easily annoyed or irritable 1 2  Afraid - awful might happen 0 2  Total GAD 7 Score 2 9     Baby's course has been uncomplicated. Baby is feeding by bottle. Infant has a pediatrician/family doctor? Yes.  Childcare strategy if returning to work/school:  did not discuss .  Pt has material needs met for her and baby: Yes.   Review of Systems:   Pertinent items are noted in HPI Denies Abnormal  vaginal discharge w/ itching/odor/irritation, headaches, visual changes, shortness of breath, chest pain, abdominal pain, severe nausea/vomiting, or problems with urination or bowel movements. Pertinent History Reviewed:  Reviewed past medical,surgical, obstetrical and family history.  Reviewed problem list, medications and allergies. OB History  Gravida Para Term Preterm AB Living  _0 SAB IAB Ectopic Multiple Live Births        0 2    # Outcome Date GA Lbr Len/2nd Weight Sex Delivery Anes PTL Lv  2 Term 05/09/21 38w6d13:53 / 00:10 7 lb 3.3 oz (3.269 kg) F Vag-Spont EPI  LIV  1 Term 01/27/15 469w2d7:03 / 01:00 8 lb 3.8 oz (3.737 kg) F Vag-Spont EPI N LIV   Physical Assessment:   Vitals:   06/16/21 1200  BP: 106/71  Pulse: 73  Weight: 131 lb  (59.4 kg)  Height: _1  (1.575 m)  Body mass index is 23.96 kg/m.       Physical Examination:   General appearance: alert, well appearing, and in no distress  Mental status: alert, oriented to person, place, and time  Skin: warm & dry   Cardiovascular: normal heart rate noted   Respiratory: normal respiratory effort, no distress   Breasts: deferred, no complaints   Abdomen: soft, non-tender   Pelvic: normal exam. Thin prep pap obtained: Yes  Rectal: not examined  Extremities: Edema: none   Chaperone: AmCelene Squibb       No results found for this or any previous visit (from the past 24 hour(s)).  Assessment & Plan:  1) Postpartum exam 2) 5 wks s/p spontaneous vaginal delivery 3) bottle feeding 4) Depression screening 5) Contraception: s/p Nexplanon placed 05/10/21 6) Cervical cancer screening> pap today 7) Gestational thrombocytopenia> CBC today  Essential components of care per ACOG recommendations:  1.  Mood and well being:  If positive depression screen, discussed and plan developed.  If using tobacco we discussed reduction/cessation and risk of relapse If current substance abuse, we discussed and referral to local resources was offered.   2. Infant care and feeding:  If breastfeeding, discussed returning to work, pumping, breastfeeding-associated pain, guidance regarding return to fertility while lactating if not using another method. If needed, patient was provided with a letter to be allowed to pump q 2-3hrs to support lactation in a private location with access to a refrigerator to store breastmilk.   Recommended that all caregivers be immunized for flu, pertussis and other preventable communicable diseases If pt does not have material needs met for her/baby, referred to local resources for help obtaining these.  3. Sexuality, contraception and birth spacing Provided guidance regarding sexuality, management of dyspareunia, and resumption of intercourse Discussed  avoiding interpregnancy interval <8m87m and recommended birth spacing of 18 months  4. Sleep and fatigue Discussed coping options for fatigue and sleep disruption Encouraged family/partner/community support of 4 hrs of uninterrupted sleep to help with mood and fatigue  5. Physical recovery  If pt had a C/S, assessed incisional pain and providing guidance on normal vs prolonged recovery If pt had a laceration, perineal healing and pain reviewed.  If urinary or fecal incontinence, discussed management and referred to PT or uro/gyn if indicated  Patient is safe to resume physical activity. Discussed attainment of healthy weight.  6.  Chronic disease management Discussed pregnancy complications if any, and their implications for future childbearing and long-term maternal health. Review recommendations for prevention of recurrent pregnancy complications, such as 17  hydroxyprogesterone caproate to reduce risk for recurrent PTB not applicable, or aspirin to reduce risk of preeclampsia not applicable. Pt had GDM: no. If yes, 2hr GTT scheduled: not applicable. Reviewed medications and non-pregnant dosing including consideration of whether pt is breastfeeding using a reliable resource such as LactMed: not applicable Referred for f/u w/ PCP or subspecialist providers as indicated: not applicable  7. Health maintenance Mammogram at 24yo or earlier if indicated Pap smears as indicated  Meds: No orders of the defined types were placed in this encounter.   Follow-up: Return in about 1 year (around 06/16/2022) for Physical.   Orders Placed This Encounter  Procedures   [place], [facility] 06/16/2021 12:25 PM

## 2021-06-17 LAB — CBC
Hematocrit: 38.2 % (ref 34.0–46.6)
Hemoglobin: 12.9 g/dL (ref 11.1–15.9)
MCH: 29.6 pg (ref 26.6–33.0)
MCHC: 33.8 g/dL (ref 31.5–35.7)
MCV: 88 fL (ref 79–97)
Platelets: 164 10*3/uL (ref 150–450)
RBC: 4.36 x10E6/uL (ref 3.77–5.28)
RDW: 13.6 % (ref 11.7–15.4)
WBC: 4.9 10*3/uL (ref 3.4–10.8)

## 2021-06-18 LAB — CYTOLOGY - PAP
Chlamydia: NEGATIVE
Comment: NEGATIVE
Comment: NORMAL
Diagnosis: NEGATIVE
Neisseria Gonorrhea: NEGATIVE

## 2022-07-22 ENCOUNTER — Ambulatory Visit
Admission: EM | Admit: 2022-07-22 | Discharge: 2022-07-22 | Disposition: A | Discharge status: Home / Self Care | Payer: Medicaid Other

## 2022-07-22 DIAGNOSIS — K0889 Other specified disorders of teeth and supporting structures: Secondary | ICD-10-CM | POA: Diagnosis not present

## 2022-07-22 MED ORDER — AMOXICILLIN-POT CLAVULANATE 875-125 MG PO TABS: 1.0000 | ORAL_TABLET | Freq: Two times a day (BID) | ORAL | 0 refills | Status: DC

## 2022-07-22 MED ORDER — IBUPROFEN 800 MG PO TABS: 800.0000 mg | ORAL_TABLET | Freq: Three times a day (TID) | ORAL | 0 refills | Status: AC | PRN

## 2022-07-22 NOTE — ED Provider Notes (Signed)
[facility]    CSN: [id number] Arrival date & time: 07/22/22  1524      History   Chief Complaint Chief Complaint  Patient presents with   Dental Pain    HPI Claudia Marks is a 26 y.o. female.   The history is provided by the patient.   Patient presents for complaints of intermittent dental pain for 1 year, with worsening over the past 24 hours.  Patient states that she has multiple areas of due to abnormalities in her mouth.  She states that she does have a area to the right upper portion of her mouth and in the right lower portion of her mouth that is giving her pain.  She states that the area in the bottom portion of her mouth near her wisdom teeth is causing her the most pain today.  She reports pain is described as throbbing".  She states she has tried taking Tylenol for her symptoms with minimal relief.  She denies fever, chills, facial swelling, nausea, vomiting, diarrhea.  She states that it is more difficult for her to chew and eat on the right side of her mouth.  She states that she is currently looking for a dentist.  Past Medical History:  Diagnosis Date   Headache(784.0)    frequent headaches    Patient Active Problem List   Diagnosis Date Noted   Gestational thrombocytopenia ([place]) 02/03/2021   Nexplanon insertion 06/27/2018    Past Surgical History:  Procedure Laterality Date   BREAST SURGERY Left 07/20/13   Left breast excisional   EXCISION OF BREAST BIOPSY Left 07/20/2013   Procedure: EXCISION OF BREAST BIOPSY;  Surgeon: Madilyn Hook, DO;  Location: [facility];  Service: General;  Laterality: Left;   no previous surgery      OB History     Gravida  2   Para  2   Term  2   Preterm      AB      Living  2      SAB      IAB      Ectopic      Multiple  0   Live Births  2            Home Medications    Prior to Admission medications   Medication Sig Start Date End Date Taking? Authorizing Provider   amoxicillin-clavulanate (AUGMENTIN) 875-125 MG tablet Take 1 tablet by mouth every 12 (twelve) hours. 07/22/22  Yes Asmar Brozek-Warren, Alda Lea, NP  ibuprofen (ADVIL) 800 MG tablet Take 1 tablet (800 mg total) by mouth every 8 (eight) hours as needed. 07/22/22  Yes Yari Szeliga-Warren, Alda Lea, NP  acetaminophen (TYLENOL) 325 MG tablet Take 2 tablets (650 mg total) by mouth every 6 (six) hours as needed (for pain scale < 4). 05/10/21   Nugent, Gerrie Nordmann, NP  Prenatal Vit-Fe Fumarate-FA (PRENATAL VITAMIN PO) Take 1 tablet by mouth daily.    [provider]    Family History Family History  Problem Relation Age of Onset   Arthritis Maternal Grandmother    Depression Maternal Grandmother    Hypertension Maternal Grandmother    Hypertension Paternal Grandmother    Arthritis Maternal Uncle     Social History Social History   Tobacco Use   Smoking status: Never   Smokeless tobacco: Never  Vaping Use   Vaping Use: Never used  Substance Use Topics   Alcohol use: No   Drug use: Never  Allergies   Patient has no known allergies.   Review of Systems Review of Systems Per HPI  Physical Exam Triage Vital Signs ED Triage Vitals  Enc Vitals Group     BP 07/22/22 1545 120/76     Pulse Rate 07/22/22 1545 74     Resp 07/22/22 1545 14     Temp 07/22/22 1545 99 F (37.2 C)     Temp Source 07/22/22 1545 Oral     SpO2 07/22/22 1545 98 %     Weight --      Height --      Head Circumference --      Peak Flow --      Pain Score 07/22/22 1543 7     Pain Loc --      Pain Edu? --      Excl. in [place]? --    No data found.  Updated Vital Signs BP 120/76 (BP Location: Right Arm)   Pulse 74   Temp 99 F (37.2 C) (Oral)   Resp 14   LMP  (Within Weeks) Comment: 2 weeks  SpO2 98%   Breastfeeding No   Visual Acuity Right Eye Distance:   Left Eye Distance:   Bilateral Distance:    Right Eye Near:   Left Eye Near:    Bilateral Near:     Physical Exam Vitals and nursing note  reviewed.  Constitutional:      General: She is not in acute distress.    Appearance: Normal appearance.  HENT:     Head: Normocephalic.     Mouth/Throat:     Lips: Pink.     Dentition: Abnormal dentition. Dental tenderness and dental caries present. No dental abscesses.   Eyes:     Extraocular Movements: Extraocular movements intact.     Pupils: Pupils are equal, round, and reactive to light.  Cardiovascular:     Rate and Rhythm: Normal rate and regular rhythm.     Pulses: Normal pulses.     Heart sounds: Normal heart sounds.  Pulmonary:     Effort: Pulmonary effort is normal.     Breath sounds: Normal breath sounds.  Abdominal:     General: Bowel sounds are normal.     Palpations: Abdomen is soft.  Skin:    General: Skin is warm and dry.  Neurological:     General: No focal deficit present.     Mental Status: She is alert and oriented to person, place, and time.  Psychiatric:        Mood and Affect: Mood normal.        Behavior: Behavior normal.      UC Treatments / Results  Labs (all labs ordered are listed, but only abnormal results are displayed) Labs Reviewed - No data to display  EKG   Radiology No results found.  Procedures Procedures (including critical care time)  Medications Ordered in UC Medications - No data to display  Initial Impression / Assessment and Plan / UC Course  I have reviewed the triage vital signs and the nursing notes.  Pertinent labs & imaging results that were available during my care of the patient were reviewed by me and considered in my medical decision making (see chart for details).  Symptoms consistent with dentalgia, no evidence of a dental abscess at this time.  Prophylactic treatment was provided with Augmentin 875/125.  For her pain, ibuprofen 800 mg was also prescribed.  To continue use of Tylenol as needed for pain  or discomfort.  Supportive care recommendations were provided to the patient along with the dental  resource guide for her to establish care with a local dentist.  Patient verbalizes understanding.  All questions were answered.  Patient is stable for discharge. Final Clinical Impressions(s) / UC Diagnoses   Final diagnoses:  Dentalgia     Discharge Instructions         Take medication as prescribed. May take over-the-counter Tylenol extra strength 1 to 2 hours after ibuprofen for breakthrough pain. Warm salt water gargles 3-4 times daily until symptoms improve. Continue warm compresses to the affected area to help with pain and discomfort. I have provided a dental resource guide for you to follow-up with a dentist.  Recommend following up within the next 7 to 10 days.      ED Prescriptions     Medication Sig Dispense Auth. Provider   amoxicillin-clavulanate (AUGMENTIN) 875-125 MG tablet Take 1 tablet by mouth every 12 (twelve) hours. 14 tablet Eulonda Andalon-Warren, Alda Lea, NP   ibuprofen (ADVIL) 800 MG tablet Take 1 tablet (800 mg total) by mouth every 8 (eight) hours as needed. 30 tablet Claudie Rathbone-Warren, Alda Lea, NP      PDMP not reviewed this encounter.   Tish Men, NP 07/22/22 1654

## 2022-07-22 NOTE — Discharge Instructions (Signed)
  Take medication as prescribed. May take over-the-counter Tylenol extra strength 1 to 2 hours after ibuprofen for breakthrough pain. Warm salt water gargles 3-4 times daily until symptoms improve. Continue warm compresses to the affected area to help with pain and discomfort. I have provided a dental resource guide for you to follow-up with a dentist.  Recommend following up within the next 7 to 10 days.  

## 2022-07-22 NOTE — ED Triage Notes (Signed)
Pt reports on and off dental pain x 1 year. Dental pain is worse since last night. Ibuprofen gives no relief.

## 2022-08-21 ENCOUNTER — Other Ambulatory Visit: Payer: Medicaid Other | Admitting: Adult Health

## 2023-02-21 ENCOUNTER — Emergency Department (HOSPITAL_COMMUNITY)
Admission: EM | Admit: 2023-02-21 | Discharge: 2023-02-22 | Disposition: A | Discharge status: Home / Self Care | Payer: BC Managed Care – PPO | Attending: Emergency Medicine | Admitting: Emergency Medicine

## 2023-02-21 ENCOUNTER — Encounter (HOSPITAL_COMMUNITY): Payer: Self-pay | Admitting: Emergency Medicine

## 2023-02-21 ENCOUNTER — Other Ambulatory Visit: Payer: Self-pay

## 2023-02-21 DIAGNOSIS — R112 Nausea with vomiting, unspecified: Secondary | ICD-10-CM | POA: Insufficient documentation

## 2023-02-21 DIAGNOSIS — T40711A Poisoning by cannabis, accidental (unintentional), initial encounter: Secondary | ICD-10-CM | POA: Insufficient documentation

## 2023-02-21 DIAGNOSIS — R42 Dizziness and giddiness: Secondary | ICD-10-CM | POA: Diagnosis not present

## 2023-02-21 DIAGNOSIS — R892 Abnormal level of other drugs, medicaments and biological substances in specimens from other organs, systems and tissues: Secondary | ICD-10-CM

## 2023-02-21 DIAGNOSIS — R000 Tachycardia, unspecified: Secondary | ICD-10-CM | POA: Insufficient documentation

## 2023-02-21 LAB — CBC WITH DIFFERENTIAL/PLATELET
Abs Immature Granulocytes: 0.02 10*3/uL (ref 0.00–0.07)
Basophils Absolute: 0 10*3/uL (ref 0.0–0.1)
Basophils Relative: 0 %
Eosinophils Absolute: 0 10*3/uL (ref 0.0–0.5)
Eosinophils Relative: 0 %
HCT: 38.9 % (ref 36.0–46.0)
Hemoglobin: 13.1 g/dL (ref 12.0–15.0)
Immature Granulocytes: 0 %
Lymphocytes Relative: 23 %
Lymphs Abs: 1.6 10*3/uL (ref 0.7–4.0)
MCH: 31.4 pg (ref 26.0–34.0)
MCHC: 33.7 g/dL (ref 30.0–36.0)
MCV: 93.3 fL (ref 80.0–100.0)
Monocytes Absolute: 0.5 10*3/uL (ref 0.1–1.0)
Monocytes Relative: 7 %
Neutro Abs: 4.7 10*3/uL (ref 1.7–7.7)
Neutrophils Relative %: 70 %
Platelets: 198 10*3/uL (ref 150–400)
RBC: 4.17 MIL/uL (ref 3.87–5.11)
RDW: 12.1 % (ref 11.5–15.5)
WBC: 6.8 10*3/uL (ref 4.0–10.5)
nRBC: 0 % (ref 0.0–0.2)

## 2023-02-21 LAB — BASIC METABOLIC PANEL
Anion gap: 10 (ref 5–15)
BUN: 11 mg/dL (ref 6–20)
CO2: 19 mmol/L — ABNORMAL LOW (ref 22–32)
Calcium: 8.3 mg/dL — ABNORMAL LOW (ref 8.9–10.3)
Chloride: 106 mmol/L (ref 98–111)
Creatinine, Ser: 0.57 mg/dL (ref 0.44–1.00)
GFR, Estimated: >60 mL/min (ref >60)
Glucose, Bld: 141 mg/dL — ABNORMAL HIGH (ref 70–99)
Potassium: 3.3 mmol/L — ABNORMAL LOW (ref 3.5–5.1)
Sodium: 135 mmol/L (ref 135–145)

## 2023-02-21 LAB — TROPONIN I (HIGH SENSITIVITY)
Troponin I (High Sensitivity): <2 ng/L (ref <18)
Troponin I (High Sensitivity): <2 ng/L (ref <18)

## 2023-02-21 MED ORDER — LORAZEPAM 2 MG/ML IJ SOLN
1.0000 mg | Freq: Once | INTRAMUSCULAR | Status: AC
  Administered 2023-02-21: 1 mg via INTRAVENOUS
  Filled 2023-02-21: qty 1

## 2023-02-21 MED ORDER — ONDANSETRON HCL 4 MG/2ML IJ SOLN
4.0000 mg | Freq: Once | INTRAMUSCULAR | Status: AC | PRN
  Administered 2023-02-21: 4 mg via INTRAVENOUS
  Filled 2023-02-21: qty 2

## 2023-02-21 NOTE — ED Triage Notes (Signed)
Pt to ed via [facility]. Pt was with friends and took some delta 8. Pt endorses tachycardia and emesis at this time. j

## 2023-02-21 NOTE — ED Notes (Signed)
Lab at bedside

## 2023-02-21 NOTE — ED Notes (Addendum)
Pt given ice chips  Pt alert, talking and laughing with visitor

## 2023-02-21 NOTE — ED Notes (Signed)
ED Provider at bedside. 

## 2023-02-21 NOTE — ED Notes (Signed)
Pt states nausea has improved.

## 2023-02-22 DIAGNOSIS — T40711A Poisoning by cannabis, accidental (unintentional), initial encounter: Secondary | ICD-10-CM | POA: Diagnosis not present

## 2023-02-22 MED ORDER — ONDANSETRON 8 MG PO TBDP: 8.0000 mg | ORAL_TABLET | Freq: Three times a day (TID) | ORAL | 0 refills | Status: DC | PRN

## 2023-02-22 MED ORDER — ONDANSETRON HCL 4 MG/2ML IJ SOLN
4.0000 mg | Freq: Once | INTRAMUSCULAR | Status: AC
  Administered 2023-02-22: 4 mg via INTRAVENOUS
  Filled 2023-02-22: qty 2

## 2023-02-22 NOTE — ED Provider Notes (Addendum)
[place] [facility] AT [facility] Provider Note   CSN: [id number] Arrival date & time: 02/21/23  2006     History  Chief Complaint  Patient presents with   Drug Overdose    Delta 8    Claudia Marks is a 27 y.o. female.  HPI    27 year old female comes in with chief complaint of delta 8 toxicity.  Patient brought here by EMS.  Patient is sleepy, mother is at the bedside.  Mother states that she was informed that patient took a delta 8 gummy and started feeling unwell.  She does not really know the individual she was with, but Claudia Marks knows that person.  Claudia Marks typically does not use any drugs.  Claudia Marks is sleepy at this time.  She states that she is feeling nauseous, dizzy with spinning sensation and she has vomited.  She also has tingling sensation over all of her body.  Home Medications Prior to Admission medications   Medication Sig Start Date End Date Taking? Authorizing Provider  ondansetron (ZOFRAN-ODT) 8 MG disintegrating tablet Take 1 tablet (8 mg total) by mouth every 8 (eight) hours as needed for nausea. 02/22/23  Yes Derwood Kaplan, MD  acetaminophen (TYLENOL) 325 MG tablet Take 2 tablets (650 mg total) by mouth every 6 (six) hours as needed (for pain scale < 4). 05/10/21   Nugent, Odie Sera, NP  amoxicillin-clavulanate (AUGMENTIN) 875-125 MG tablet Take 1 tablet by mouth every 12 (twelve) hours. 07/22/22   Leath-Warren, Sadie Haber, NP  ibuprofen (ADVIL) 800 MG tablet Take 1 tablet (800 mg total) by mouth every 8 (eight) hours as needed. 07/22/22   Leath-Warren, Sadie Haber, NP  Prenatal Vit-Fe Fumarate-FA (PRENATAL VITAMIN PO) Take 1 tablet by mouth daily.    [provider]      Allergies    Patient has no known allergies.    Review of Systems   Review of Systems  All other systems reviewed and are negative.   Physical Exam Updated Vital Signs BP (!) 99/55   Pulse 95   Temp 98.4 F (36.9 C) (Oral)   Resp 10   Ht 5\' 2"  (1.575 m)    Wt 52.2 kg   SpO2 96%   BMI 21.03 kg/m  Physical Exam Vitals and nursing note reviewed.  Constitutional:      Appearance: She is well-developed. She is ill-appearing.     Comments: Somnolent  HENT:     Head: Normocephalic and atraumatic.  Eyes:     Extraocular Movements: Extraocular movements intact.     Pupils: Pupils are equal, round, and reactive to light.     Comments: Bilateral horizontal nystagmus  Cardiovascular:     Rate and Rhythm: Tachycardia present.  Pulmonary:     Effort: Pulmonary effort is normal.  Musculoskeletal:     Cervical back: Normal range of motion.  Skin:    General: Skin is dry.  Neurological:     Mental Status: She is oriented to person, place, and time.     ED Results / Procedures / Treatments   Labs (all labs ordered are listed, but only abnormal results are displayed) Labs Reviewed  BASIC METABOLIC PANEL - Abnormal; Notable for the following components:      Result Value   Potassium 3.3 (*)    CO2 19 (*)    Glucose, Bld 141 (*)    Calcium 8.3 (*)    All other components within normal limits  CBC WITH DIFFERENTIAL/PLATELET  TROPONIN  I (HIGH SENSITIVITY)  TROPONIN I (HIGH SENSITIVITY)    EKG EKG Interpretation  Date/Time:  "Sunday February 21 2023 20:19:26 EDT Ventricular Rate:  135 PR Interval:  135 QRS Duration: 95 QT Interval:  254 QTC Calculation: 381 R Axis:   73 Text Interpretation: Sinus tachycardia RSR' in V1 or V2, right VCD or RVH Borderline repol abnrm, inferolateral leads No acute changes Confirmed by Chesney Klimaszewski ([id number]) on 02/22/2023 12:36:21 AM  Radiology No results found.  Procedures .Critical Care  Performed by: Elek Holderness, MD Authorized by: Nader Boys, MD   Critical care provider statement:    Critical care time (minutes):  33   Critical care was necessary to treat or prevent imminent or life-threatening deterioration of the following conditions:  [place]oxid[place]   Critical care was time spent personally  by me on the following activities:  Development of treatment plan with patient or surrogate, discussions with consultants, evaluation of patient's response to treatment, examination of patient, ordering and review of laboratory studies, ordering and review of radiographic studies, ordering and performing treatments and interventions, pulse oximetry, re-evaluation of patient's condition and review of old charts     Medications Ordered in ED Medications  ondansetron (ZOFRAN) injection 4 mg (4 mg Intravenous Given 02/21/23 2025)  LORazepam (ATIVAN) injection 1 mg (1 mg Intravenous Given 02/21/23 2115)  ondansetron (ZOFRAN) injection 4 mg (4 mg Intravenous Given 02/22/23 0035)    ED Course/ Medical Decision Making/ A&P                             Medical Decision Making Amount and/or Complexity of Data Reviewed Labs: ordered.  Risk Prescription drug management.   26 year old patient comes in with chief complaint of drug toxicity.  Allegedly she took a delta 8 gummy, and has now been having dizziness, nausea, vomiting and tingling sensation.  Patient has no significant past medical history.  Mother is at the bedside.  She states that she is not aware of any drug use history herself.  On exam patient has nystagmus.  She appears ill.  Patient is tachycardic.  She is protecting airway.  Differential diagnosis essentially is drug toxicity.  We will give patient Ativan and reassess.  Reassessment: Patient reassessed at 11 PM.  Father is now at the bedside.  She states that she is feeling better.  Patient has improved heart rate into 105 range now.  Anticipate discharge.  Reassessment: Patient reassessed at 12:15 AM.  Mother is at the bedside now.  She is comfortable with patient coming home.  [name] is still sleepy, but arousable and states that she is feeling better.  Heart rate is now in the 90s.  Will give her IV Zofran prior to discharge.  Mother to take patient with her.  Advised that she  sleep in the same room so that patient does not try to get up in the middle night and fall.  Work note provided.  Final Clinical Impression(s) / ED Diagnoses Final diagnoses:  Drug toxicity    Rx / DC Orders ED Discharge Orders          Ordered    ondansetron (ZOFRAN-ODT) 8 MG disintegrating tablet  Every 8 hours PRN        06" /03/24 0032              Derwood Kaplan, MD 02/22/23 1610    Derwood Kaplan, MD 02/22/23 [phone]

## 2023-02-22 NOTE — Discharge Instructions (Addendum)
Claudia Marks likely has effects of accidental poisoning from the gummy she took. Please ensure she hydrates well.  We have prescribed nausea medication. Dizziness, malaise might persist tomorrow.  Return to the emergency room if the symptoms get worse.

## 2024-06-26 ENCOUNTER — Ambulatory Visit: Admitting: Obstetrics and Gynecology

## 2024-06-26 ENCOUNTER — Encounter: Payer: Self-pay | Admitting: Obstetrics and Gynecology

## 2024-06-26 VITALS — BP 114/75 | HR 82 | Ht 62.0 in | Wt 130.0 lb

## 2024-06-26 DIAGNOSIS — Z3046 Encounter for surveillance of implantable subdermal contraceptive: Secondary | ICD-10-CM | POA: Diagnosis not present

## 2024-06-26 MED ORDER — ETONOGESTREL 68 MG ~~LOC~~ IMPL
68.0000 mg | DRUG_IMPLANT | Freq: Once | SUBCUTANEOUS | Status: AC
Start: 2024-06-26 — End: 2024-06-26
  Administered 2024-06-26: 68 mg via SUBCUTANEOUS

## 2024-06-26 NOTE — Progress Notes (Signed)
 GYNECOLOGY OFFICE PROCEDURE NOTE  Claudia Marks is a 28 y.o. [id number] here for Nexplanon  removal and reinsertion.  Last pap smear was on 06/16/21 and was normal. Plans to schedule annual exam with Pap during check out. No other gynecologic concerns.  Nexplanon  Removal and Reinsertion Patient identified, informed consent performed, consent signed.   Patient does understand that irregular bleeding is a very common side effect of this medication.  Appropriate time out taken. Nexplanon  site identified in left arm.  Area prepped in usual sterile fashon. One ml of 1% lidocaine  was used to anesthetize the area at the distal end of the implant. Discussed with patient that due to manufacturer recommendations Nexplanon  insertion site will be moved. New insertion site indentified and injected with 2 mL of 1% Lidocaine .  Removal site prepped with betadine. A small stab incision was made right beside the implant on the distal portion. The Nexplanon  rod was grasped and removed without difficulty. There was minimal blood loss. There were no complications. She was re-prepped with betadine at new insertion site, Nexplanon  removed from packaging, device confirmed in needle, then inserted full length of needle and withdrawn per handbook instructions. Nexplanon  was able to palpated in the patient's arm; patient palpated the insert herself.  There was minimal blood loss. Patient insertion site covered with gauze and a pressure bandage to reduce any bruising.   The patient tolerated the procedure well and was given post procedure instructions.  Vernell Ruddle, SNM 06/26/2024 4:01 PM

## 2024-08-14 ENCOUNTER — Encounter: Payer: Self-pay | Admitting: Women's Health

## 2024-08-14 ENCOUNTER — Other Ambulatory Visit (HOSPITAL_COMMUNITY)
Admission: RE | Admit: 2024-08-14 | Discharge: 2024-08-14 | Disposition: A | Source: Ambulatory Visit | Attending: Women's Health | Admitting: Women's Health

## 2024-08-14 ENCOUNTER — Ambulatory Visit: Admitting: Women's Health

## 2024-08-14 VITALS — BP 116/81 | HR 74 | Ht 62.0 in | Wt 132.0 lb

## 2024-08-14 DIAGNOSIS — Z1151 Encounter for screening for human papillomavirus (HPV): Secondary | ICD-10-CM

## 2024-08-14 DIAGNOSIS — Z01419 Encounter for gynecological examination (general) (routine) without abnormal findings: Secondary | ICD-10-CM | POA: Diagnosis not present

## 2024-08-14 DIAGNOSIS — Z1331 Encounter for screening for depression: Secondary | ICD-10-CM | POA: Diagnosis not present

## 2024-08-14 NOTE — Progress Notes (Signed)
 WELL-WOMAN EXAMINATION Patient name: Claudia Marks MRN [id number]  Date of birth: 12/17/1995 Chief Complaint:   Gynecologic Exam (Last pap 06-16-21 normal)  History of Present Illness:   Claudia Marks is a 28 y.o. [id number] Caucasian female being seen today for a routine well-woman exam.  Current complaints: intermittent bleeding recently. Denies abnormal discharge, itching/odor/irritation.    PCP: none      does not desire labs No LMP recorded. Patient has had an implant. The current method of family planning is Nexplanon  inserted 06/26/24 Last pap 06/16/21. Results were: NILM w/ HRHPV not done. H/O abnormal pap: no Last mammogram: u/s 2014 w/ subsequent surgical removal of fibroadenoma Family h/o breast cancer: yes MGGM Last colonoscopy: never. Results were: N/A. Family h/o colorectal cancer: no     08/14/2024    8:41 AM 01/31/2021   10:17 AM 10/31/2020    9:43 AM 06/06/2018   10:23 AM  Depression screen PHQ 2/9  Decreased Interest 0 0 2 0  Down, Depressed, Hopeless 1 0 1 0  PHQ - 2 Score 1 0 3 0  Altered sleeping 0 1 1   Tired, decreased energy 1 1 3    Change in appetite 0 0 3   Feeling bad or failure about yourself  1 0 0   Trouble concentrating 0 0 2   Moving slowly or fidgety/restless 0 0 0   Suicidal thoughts 0 0 0   PHQ-9 Score 3 2  12        Data saved with a previous flowsheet row definition        08/14/2024    8:41 AM 01/31/2021   10:19 AM 10/31/2020    9:43 AM  GAD 7 : Generalized Anxiety Score  Nervous, Anxious, on Edge 1 1 1   Control/stop worrying 0 0 1  Worry too much - different things 1 0 1  Trouble relaxing 0 0 1  Restless 0 0 1  Easily annoyed or irritable 1 1 2   Afraid - awful might happen 0 0 2  Total GAD 7 Score 3 2 9      Review of Systems:   Pertinent items are noted in HPI Denies any headaches, blurred vision, fatigue, shortness of breath, chest pain, abdominal pain, abnormal vaginal discharge/itching/odor/irritation, problems with periods,  bowel movements, urination, or intercourse unless otherwise stated above. Pertinent History Reviewed:  Reviewed past medical,surgical, social and family history.  Reviewed problem list, medications and allergies. Physical Assessment:   Vitals:   08/14/24 0830  BP: 116/81  Pulse: 74  Weight: 132 lb (59.9 kg)  Height: 5' 2 (1.575 m)  Body mass index is 24.14 kg/m.        Physical Examination:   General appearance - well appearing, and in no distress  Mental status - alert, oriented to person, place, and time  Psych:  She has a normal mood and affect  Skin - warm and dry, normal color, no suspicious lesions noted  Chest - effort normal, all lung fields clear to auscultation bilaterally  Heart - normal rate and regular rhythm  Neck:  midline trachea, no thyromegaly or nodules  Breasts - breasts appear normal, no suspicious masses, no skin or nipple changes or  axillary nodes  Abdomen - soft, nontender, nondistended, no masses or organomegaly  Pelvic - VULVA: normal appearing vulva with no masses, tenderness or lesions  VAGINA: normal appearing vagina with normal color and discharge, no lesions  CERVIX: normal appearing cervix without discharge or lesions, no CMT  Thin prep pap is done w/ HR HPV cotesting  UTERUS: uterus is felt to be normal size, shape, consistency and nontender   ADNEXA: No adnexal masses or tenderness noted.  Extremities:  No swelling or varicosities noted  Chaperone: Peggy Dones  No results found for this or any previous visit (from the past 24 hours).  Assessment & Plan:  1) Well-Woman Exam  2) Intermittent bleeding> otherwise asymptomatic, declined CV swab. Just had Nexplanon  replaced last month, discussed likely from this, if doesn't improve let us  know  Labs/procedures today: pap  Mammogram: @ 27yo, or sooner if problems Colonoscopy: @ 27yo, or sooner if problems  No orders of the defined types were placed in this encounter.   Meds: No orders of the  defined types were placed in this encounter.   Follow-up: Return in about 1 year (around 08/14/2025) for Physical.  Suzen JONELLE Fetters CNM, WHNP-BC 08/14/2024 9:20 AM

## 2024-08-18 LAB — CYTOLOGY - PAP
Comment: NEGATIVE
Diagnosis: NEGATIVE
Diagnosis: REACTIVE
High risk HPV: NEGATIVE

## 2024-08-21 ENCOUNTER — Ambulatory Visit: Payer: Self-pay | Admitting: Women's Health
